# Patient Record
Sex: Female | Born: 1980 | Race: White | Hispanic: No | Marital: Married | Smoking: Never smoker
Health system: Southern US, Community
[De-identification: ages and names within clinical notes are randomized; demographics above are authoritative.]

## PROBLEM LIST (undated history)

## (undated) DIAGNOSIS — J309 Allergic rhinitis, unspecified: Secondary | ICD-10-CM

## (undated) DIAGNOSIS — E785 Hyperlipidemia, unspecified: Secondary | ICD-10-CM

## (undated) DIAGNOSIS — F909 Attention-deficit hyperactivity disorder, unspecified type: Secondary | ICD-10-CM

## (undated) DIAGNOSIS — M5136 Other intervertebral disc degeneration, lumbar region: Secondary | ICD-10-CM

## (undated) HISTORY — DX: Allergic rhinitis, unspecified: J30.9

## (undated) HISTORY — DX: Hyperlipidemia, unspecified: E78.5

## (undated) HISTORY — PX: KNEE SURGERY: SHX244

## (undated) HISTORY — DX: Other intervertebral disc degeneration, lumbar region: M51.36

---

## 2009-10-25 ENCOUNTER — Ambulatory Visit: Payer: Self-pay | Admitting: Family Medicine

## 2010-10-13 NOTE — Assessment & Plan Note (Signed)
Summary: SOB, chest congestion x 6 dys rm 3   Vital Signs:  Patient Profile:   30 Years Old Female CC:      Cold & URI symptoms Height:     69 inches Weight:      186 pounds O2 Sat:      98 % O2 treatment:    Room Air Temp:     97.0 degrees F rectal Pulse rate:   71 / minute Pulse rhythm:   regular Resp:     17 per minute BP sitting:   109 / 72  (right arm) Cuff size:   regular  Vitals Entered By: Areta Haber CMA (October 25, 2009 10:05 AM)                  Prior Medication List:  No prior medications documented  Current Allergies: No known allergies History of Present Illness Chief Complaint: Cold & URI symptoms History of Present Illness: Starting Sunday started coughing. Hoarseness and loss of voice as well as fatigue. She has had marked fatigue and low exercise tolerence. No wheezing , asthma or smoking HX.   Current Problems: BRONCHITIS, ACUTE (ICD-466.0) COUGH (ICD-786.2) FAMILY HISTORY DIABETES 1ST DEGREE RELATIVE (ICD-V18.0) FAMILY HISTORY OF CERVICAL CANCER (ICD-V17.3) FAMILY HISTORY BREAST CANCER 1ST DEGREE RELATIVE <50 (ICD-V16.3)   Current Meds SEASONIQUE 0.15-0.03 &0.01 MG TABS (LEVONORGEST-ETH ESTRAD 91-DAY) 1 tab by mouth once daily MUCINEX D 60-600 MG XR12H-TAB (PSEUDOEPHEDRINE-GUAIFENESIN) As directed ADVIL COLD & SINUS LIQUI-GELS 30-200 MG CAPS (PSEUDOEPHEDRINE-IBUPROFEN) As directed ZITHROMAX Z-PAK 250 MG  TABS (AZITHROMYCIN) Use as directed PROAIR HFA 108 (90 BASE) MCG/ACT  AERS (ALBUTEROL SULFATE) 2 inh q4h as needed shortness of breath  REVIEW OF SYSTEMS Constitutional Symptoms      Denies fever, chills, night sweats, weight loss, weight gain, and fatigue.  Eyes       Denies change in vision, eye pain, eye discharge, glasses, contact lenses, and eye surgery. Ear/Nose/Throat/Mouth       Complains of frequent runny nose and hoarseness.      Denies hearing loss/aids, change in hearing, ear pain, ear discharge, dizziness, frequent nose  bleeds, sinus problems, sore throat, and tooth pain or bleeding.      Comments: x 6 days Respiratory       Complains of dry cough.      Denies productive cough, wheezing, shortness of breath, asthma, bronchitis, and emphysema/COPD.      Comments: chest congestion Cardiovascular       Denies murmurs, chest pain, and tires easily with exhertion.    Gastrointestinal       Denies stomach pain, nausea/vomiting, diarrhea, constipation, blood in bowel movements, and indigestion. Genitourniary       Denies painful urination, kidney stones, and loss of urinary control. Neurological       Denies paralysis, seizures, and fainting/blackouts. Musculoskeletal       Denies muscle pain, joint pain, joint stiffness, decreased range of motion, redness, swelling, muscle weakness, and gout.  Skin       Denies bruising, unusual mles/lumps or sores, and hair/skin or nail changes.  Psych       Denies mood changes, temper/anger issues, anxiety/stress, speech problems, depression, and sleep problems. Other Comments: Pt has no PCP.   Past History:  Family History: Last updated: 10/25/2009 Family History Breast cancer 1st degree relative <50 Family History of Cervical cancer Family History Diabetes 1st degree relative Family History Other cancer  Social History: Last updated: 10/25/2009 Married Never Smoked Alcohol use-no  Regular exercise-yes  Risk Factors: Exercise: yes (10/25/2009)  Risk Factors: Smoking Status: never (10/25/2009)  Past Medical History: Unremarkable  Past Surgical History: ACL Surgery  Family History: Reviewed history and no changes required. Family History Breast cancer 1st degree relative <50 Family History of Cervical cancer Family History Diabetes 1st degree relative Family History Other cancer  Social History: Reviewed history and no changes required. Married Never Smoked Alcohol use-no Regular exercise-yes Smoking Status:  never Does Patient Exercise:   yes Physical Exam General appearance: well developed, well nourished,mild distress Head: normocephalic, atraumatic Nasal: pale, boggy, swollen nasal turbinates Neck: supple,anterior lymphadenopathy present Chest/Lungs: rare wheeze in the R lung Heart: regular rate and  rhythm, no murmur Abdomen: soft, non-tender without obvious organomegaly Back: no tenderness over musculature, straight leg raises negative bilaterally, deep tendon reflexes 2+ at achilles and patella Skin: no obvious rashes or lesions MSE: oriented to time, place, and person Assessment New Problems: BRONCHITIS, ACUTE (ICD-466.0) COUGH (ICD-786.2) FAMILY HISTORY DIABETES 1ST DEGREE RELATIVE (ICD-V18.0) FAMILY HISTORY OF CERVICAL CANCER (ICD-V17.3) FAMILY HISTORY BREAST CANCER 1ST DEGREE RELATIVE <50 (ICD-V16.3)  Bronchitis  Patient Education: Patient and/or caregiver instructed in the following: rest fluids and Tylenol.  Plan New Medications/Changes: PROAIR HFA 108 (90 BASE) MCG/ACT  AERS (ALBUTEROL SULFATE) 2 inh q4h as needed shortness of breath  #1 x 0, 10/25/2009, Hassan Rowan MD ZITHROMAX Z-PAK 250 MG  TABS (AZITHROMYCIN) Use as directed  #1 x 0, 10/25/2009, Hassan Rowan MD  New Orders: New Patient Level III [phone] T-Chest x-ray, 2 views [71020] Nebulizer Tx [94640] Solumedrol up to 125mg  [J2930] Admin of Therapeutic Inj  intramuscular or subcutaneous [96372] Ipratropium inhalation sol. unit dose [U0454] Albuterol Sulfate Sol 1mg  unit dose [J7613] Nebulizer Tx [94640] Planning Comments:   as below use nebulizer as needed  Follow Up: Follow up in 2-3 days if no improvement, Follow up with Primary Physician Work/School Excuse: Return to work/school in 3 days  The patient and/or caregiver has been counseled thoroughly with regard to medications prescribed including dosage, schedule, interactions, rationale for use, and possible side effects and they verbalize understanding.  Diagnoses and expected course  of recovery discussed and will return if not improved as expected or if the condition worsens. Patient and/or caregiver verbalized understanding.   PROCEDURE: Follow up: moving air better after aerosol treatment Prescriptions: PROAIR HFA 108 (90 BASE) MCG/ACT  AERS (ALBUTEROL SULFATE) 2 inh q4h as needed shortness of breath  #1 x 0   Entered and Authorized by:   Hassan Rowan MD   Signed by:   Hassan Rowan MD on 10/25/2009   Method used:   Print then Give to Patient   RxID:   [id number] ZITHROMAX Z-PAK 250 MG  TABS (AZITHROMYCIN) Use as directed  #1 x 0   Entered and Authorized by:   Hassan Rowan MD   Signed by:   Hassan Rowan MD on 10/25/2009   Method used:   Print then Give to Patient   RxID:   [id number]   Patient Instructions: 1)  Please schedule a follow-up appointment as needed. 2)  Please schedule an appointment with your primary doctor in :7-14 days 3)  Take your antibiotic as prescribed until ALL of it is gone, but stop if you develop a rash or swelling and contact our office as soon as possible. 4)  Acute bronchitis symptoms for less than 10 days are not helped by antibiotics. take over the counter cough medications. call if no improvment in  5-7 days, sooner  if increasing cough, fever, or new symptoms( shortness of breath, chest pain). 5)  Recommended remaining out of school for next 3 days.   Laboratory Results       Medication Administration  Injection # 1:    Medication: Solumedrol up to 125mg     Diagnosis: BRONCHITIS, ACUTE (ICD-466.0)    Route: IM    Site: LUOQ gluteus    Exp Date: 09/31/2013    Lot #: [id number]    Mfr: Pharmacia    Patient tolerated injection without complications    Given by: Areta Haber CMA (October 25, 2009 11:27 AM)  Medication # 1:    Medication: Ipratropium inhalation sol. unit dose    Diagnosis: BRONCHITIS, ACUTE (ICD-466.0)    Route: inhaled    Exp Date: 08/12/2010    Lot #: [id number]    Mfr: Mylan    Patient tolerated  medication without complications    Given by: Areta Haber CMA (October 25, 2009 11:28 AM)  Medication # 2:    Medication: Albuterol Sulfate Sol 1mg  unit dose    Diagnosis: BRONCHITIS, ACUTE (ICD-466.0)    Route: inhaled    Exp Date: 08/12/2010    Lot #: [id number]    Mfr: Mylan    Patient tolerated medication without complications    Given by: Areta Haber CMA (October 25, 2009 11:28 AM)  Orders Added: 1)  New Patient Level III [99203] 2)  T-Chest x-ray, 2 views [71020] 3)  Nebulizer Tx [94640] 4)  Solumedrol up to 125mg  [J2930] 5)  Admin of Therapeutic Inj  intramuscular or subcutaneous [96372] 6)  Ipratropium inhalation sol. unit dose [J7644] 7)  Albuterol Sulfate Sol 1mg  unit dose [J7613] 8)  Nebulizer Tx [66440]

## 2010-10-13 NOTE — Letter (Signed)
Summary: Out of Work  [facility]  [address]   [place], [place] [address]   Phone: [phone]  Fax: [phone]    October 25, 2009   Employee:  ALEZA PEW    To Whom It May Concern:   For Medical reasons, please excuse the above named employee from work for the following dates:  Start:   10/25/2009  Return:   10/28/2009  If you need additional information, please feel free to contact our office.         Sincerely,    Hassan Rowan MD

## 2010-10-19 ENCOUNTER — Encounter: Payer: Self-pay | Admitting: Family Medicine

## 2010-10-22 ENCOUNTER — Encounter: Payer: Self-pay | Admitting: Family Medicine

## 2010-10-22 ENCOUNTER — Ambulatory Visit (INDEPENDENT_AMBULATORY_CARE_PROVIDER_SITE_OTHER): Payer: BC Managed Care – PPO | Admitting: Family Medicine

## 2010-10-22 DIAGNOSIS — J069 Acute upper respiratory infection, unspecified: Secondary | ICD-10-CM

## 2010-10-27 ENCOUNTER — Encounter: Payer: Self-pay | Admitting: Family Medicine

## 2010-10-27 ENCOUNTER — Ambulatory Visit (INDEPENDENT_AMBULATORY_CARE_PROVIDER_SITE_OTHER): Payer: BC Managed Care – PPO | Admitting: Family Medicine

## 2010-10-27 DIAGNOSIS — M25569 Pain in unspecified knee: Secondary | ICD-10-CM

## 2010-10-29 NOTE — Assessment & Plan Note (Signed)
Summary: SINUS PRESSURE,SWEATS/WB Room 4   Vital Signs:  Patient Profile:   30 Years Old Female CC:      Left ear pain, sore throat, sinus headache x 6 days Height:     69 inches Weight:      192 pounds O2 Sat:      99 % O2 treatment:    Room Air Temp:     99.1 degrees F oral Pulse rate:   77 / minute Pulse rhythm:   regular Resp:     16 per minute BP sitting:   128 / 83  (left arm) Cuff size:   regular  Vitals Entered By: Emilio Math (October 22, 2010 8:50 AM)                  Current Allergies: No known allergies History of Present Illness Chief Complaint: Left ear pain, sore throat, sinus headache x 6 days History of Present Illness:  Subjective: Patient complains of sore throa that started 5 days ago and now resolved.  She still has soreness in her anterior neck. + cough for about 3 days No pleuritic pain No wheezing + nasal congestion + post-nasal drainage + frontal  sinus pain/pressure No itchy/red eyes ? right earache No hemoptysis No SOB + fever/chills + nausea resolved No vomiting No abdominal pain No diarrhea No skin rashes + fatigue No myalgias + headache Used OTC meds without relief   Current Meds SEASONIQUE 0.15-0.03 &0.01 MG TABS (LEVONORGEST-ETH ESTRAD 91-DAY) 1 tab by mouth once daily MUCINEX D 60-600 MG XR12H-TAB (PSEUDOEPHEDRINE-GUAIFENESIN) As directed AZITHROMYCIN 250 MG TABS (AZITHROMYCIN) Two tabs by mouth on day 1, then 1 tab daily on days 2 through 5 (Rx void after 10/30/10) BENZONATATE 200 MG CAPS (BENZONATATE) One by mouth hs as needed cough  REVIEW OF SYSTEMS Constitutional Symptoms       Complains of fever and chills.     Denies night sweats, weight loss, weight gain, and fatigue.  Eyes       Denies change in vision, eye pain, eye discharge, glasses, contact lenses, and eye surgery. Ear/Nose/Throat/Mouth       Complains of ear pain and sore throat.      Denies hearing loss/aids, change in hearing, ear discharge,  dizziness, frequent runny nose, frequent nose bleeds, sinus problems, hoarseness, and tooth pain or bleeding.  Respiratory       Complains of productive cough.      Denies dry cough, wheezing, shortness of breath, asthma, bronchitis, and emphysema/COPD.  Cardiovascular       Denies murmurs, chest pain, and tires easily with exhertion.    Gastrointestinal       Denies stomach pain, nausea/vomiting, diarrhea, constipation, blood in bowel movements, and indigestion. Genitourniary       Denies painful urination, kidney stones, and loss of urinary control. Neurological       Denies paralysis, seizures, and fainting/blackouts. Musculoskeletal       Denies muscle pain, joint pain, joint stiffness, decreased range of motion, redness, swelling, muscle weakness, and gout.  Skin       Denies bruising, unusual mles/lumps or sores, and hair/skin or nail changes.  Psych       Denies mood changes, temper/anger issues, anxiety/stress, speech problems, depression, and sleep problems.  Past History:  Past Medical History: Reviewed history from 10/25/2009 and no changes required. Unremarkable  Past Surgical History: Reviewed history from 10/25/2009 and no changes required. ACL Surgery  Family History: Reviewed history from 10/25/2009 and  no changes required. 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  Extraocular movement is intact.  Conjunctivae are not inflamed.  Ears:  Canals normal.  Tympanic membranes normal.   Nose:  Normal septum.  Normal turbinates, mildly congested.  Frontal sinus tenderness present.  Pharynx:   Normal  Neck:  Supple.  Tender shotty anterior/posterior nodes are palpated bilaterally.  Lungs:  Clear to auscultation.  Breath sounds are equal.  Heart:  Regular rate and rhythm without murmurs, rubs, or gallops.  Abdomen:  Nontender without masses or hepatosplenomegaly.  Bowel sounds are present.  No CVA or flank tenderness.  Skin:  No rash Rapid strep test negative  CBC:  WBC 7.3 with normal diff Assessment New Problems: UPPER RESPIRATORY INFECTION (ICD-465.9)  VIRAL URI   Plan New Medications/Changes: BENZONATATE 200 MG CAPS (BENZONATATE) One by mouth hs as needed cough  #12 x 0, 10/22/2010, Donna Christen MD AZITHROMYCIN 250 MG TABS (AZITHROMYCIN) Two tabs by mouth on day 1, then 1 tab daily on days 2 through 5 (Rx void after 10/30/10)  #6 tabs x 0, 10/22/2010, Donna Christen MD  New Orders: Rapid Strep [95621] CBC w/Diff [30865-78469] Est. Patient Level III [62952] Pulse Oximetry (single measurment) [94760] Planning Comments:   Treat symptomatically for now:  Increase fluid intake, begin expectorant/decongestant, cough suppressant at bedtime.  If fever/chills/sweats persist, or if not improving 5  days begin Z-pack (given Rx to hold).  Followup with PCP if not improving 7 to 10 days.   The patient and/or caregiver has been counseled thoroughly with regard to medications prescribed including dosage, schedule, interactions, rationale for use, and possible side effects and they verbalize understanding.  Diagnoses and expected course of recovery discussed and will return if not improved as expected or if the condition worsens. Patient and/or caregiver verbalized understanding.  Prescriptions: BENZONATATE 200 MG CAPS (BENZONATATE) One by mouth hs as needed cough  #12 x 0   Entered and Authorized by:   Donna Christen MD   Signed by:   Donna Christen MD on 10/22/2010   Method used:   Print then Give to Patient   RxID:   [id number] AZITHROMYCIN 250 MG TABS (AZITHROMYCIN) Two tabs  by mouth on day 1, then 1 tab daily on days 2 through 5 (Rx void after 10/30/10)  #6 tabs x 0   Entered and Authorized by:   Donna Christen MD   Signed by:   Donna Christen MD on 10/22/2010   Method used:   Print then Give to Patient   RxID:   [id number]   Patient Instructions: 1)  Take Mucinex D (guaifenesin with decongestant) twice daily for congestion. 2)  Increase fluid intake, rest. 3)  May use Afrin nasal spray (or generic oxymetazoline) twice daily for about 5 days.  Also recommend using saline nasal spray several times daily and/or saline nasal irrigation. 4)  Begin Azithromycin if not improving about 5 days or if persistent fever develops. 5)  Followup with family doctor if not improving 7 to 10 days.   Orders Added: 1)  Rapid Strep [87880] 2)  CBC w/Diff [56433-29518] 3)  Est. Patient Level III [84166] 4)  Pulse  Oximetry (single measurment) [94760]  Appended Document: SINUS PRESSURE,SWEATS/WB Room 4 rapid strep: Negative

## 2010-11-04 NOTE — Assessment & Plan Note (Signed)
Summary: ZOX:WRUEA knee pain   Vital Signs:  Patient profile:   30 year old female Height:      69 inches Weight:      196 pounds Pulse rate:   84 / minute BP sitting:   121 / 73  (right arm) Cuff size:   regular  Vitals Entered By: Avon Gully CMA, Duncan Dull) (October 27, 2010 9:40 AM) CC: rt knee pain x 1 month, had ACL reconstruction in 10 grd   Primary Care Provider:  [name], MD  CC:  rt knee pain x 1 month and had ACL reconstruction in 10 grd.  History of Present Illness: Was playing basket ball and was hit from the side and had an ACL tear. Had recontruxtion and has done well. Pain dstarted again in December.  Has been worse when it is cold. Pain iso n both sides of the patella.  Pianful going up and down stairs. Can't knee on the ground. Pain with Varus stress.  Knows has gained some weight in the last couple of years.  Pain now for 6-8 weeks. Trying to train for a triathelon. Using IBU frequently. Right now not using anything.  Has been trying to stretch because gets tight in her hamstrings.  Occ swelling on thr right.  Sometime can't fully flex her knee.   Current Medications (verified): 1)  Seasonique 0.15-0.03 &0.01 Mg Tabs (Levonorgest-Eth Estrad 91-Day) .[name].. 1 Tab By Mouth Once Daily 2)  Mucinex D 60-600 Mg Xr12h-Tab (Pseudoephedrine-Guaifenesin) .... As Directed 3)  Azithromycin 250 Mg Tabs (Azithromycin) .... Two Tabs By Mouth On Day 1, Then 1 Tab Daily On Days 2 Through 5 (Rx Void After 10/30/10) 4)  Benzonatate 200 Mg Caps (Benzonatate) .... One By Mouth Hs As Needed Cough  Allergies (verified): No Known Drug Allergies  Comments:  Nurse/Medical Assistant: The patient's medications and allergies were reviewed with the patient and were updated in the Medication and Allergy Lists. Avon Gully CMA, Duncan Dull) (October 27, 2010 9:41 AM)  Past History:  Past Medical History: Last updated: 10/25/2009 Unremarkable  Family History: Last updated:  10/27/2010 Family History Breast cancer 1st degree relative <50 Family History of Cervical cancer Family History Diabetes 1st degree relative Family History Other cancer Parent with premature OA.   Social History: Last updated: 10/27/2010 Married.  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   Past Surgical History: ACL Surgery in 10th grade  Family History: Family History Breast cancer 1st degree relative <50 Family History of Cervical cancer Family History Diabetes 1st degree relative Family History Other cancer Parent with premature OA.   Social History: Married.  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   Physical Exam  General:  Well-developed,well-nourished,in no acute distress; alert,appropriate and cooperative throughout examination Msk:  Right Knee with normal flexion, slightly dec extension compared to the left.  No appearent swelling or bruising. NOntender on exam.  Hip, knee, and ankle strength 5/5 bilat.  Dec laxity on the right.  Neg [place]. No crepitus    Impression & Recommendations:  Problem # 1:  KNEE PAIN (ICD-719.46) Discussed will make referral since has had prior ACL repair and ans she is very active. Her exam is fairly normal but clearly I want her to be albel to train and compete.  She may needs xrays.  Orders: Orthopedic Referral (Ortho)  Complete Medication List: 1)  Seasonique 0.15-0.03 &0.01 Mg Tabs (Levonorgest-eth estrad 91-day) .[name].. 1 tab  by mouth once daily  Patient Instructions: 1)  We will call you with the referral.    Orders Added: 1)  Orthopedic Referral [Ortho] 2)  New Patient Level III [23762]

## 2010-11-05 ENCOUNTER — Other Ambulatory Visit: Payer: Self-pay | Admitting: Sports Medicine

## 2010-11-05 ENCOUNTER — Ambulatory Visit
Admission: RE | Admit: 2010-11-05 | Discharge: 2010-11-05 | Disposition: A | Discharge status: Home / Self Care | Payer: BC Managed Care – PPO | Source: Ambulatory Visit | Attending: Sports Medicine | Admitting: Sports Medicine

## 2010-11-05 DIAGNOSIS — M25561 Pain in right knee: Secondary | ICD-10-CM

## 2010-11-10 NOTE — Letter (Signed)
Summary: Registration, Billing Information  Registration, Billing Information   Imported By: Maryln Gottron 11/02/2010 10:21:53  _____________________________________________________________________  External Attachment:    Type:   Image     Comment:   External Document

## 2010-11-30 ENCOUNTER — Ambulatory Visit: Payer: BC Managed Care – PPO | Admitting: Family Medicine

## 2010-11-30 ENCOUNTER — Encounter: Payer: Self-pay | Admitting: Family Medicine

## 2010-11-30 DIAGNOSIS — J019 Acute sinusitis, unspecified: Secondary | ICD-10-CM

## 2010-12-10 NOTE — Assessment & Plan Note (Signed)
Summary: Acute sinusitis   Vital Signs:  Patient profile:   30 year old female Height:      69 inches Weight:      194 pounds Temp:     98.0 degrees F oral Pulse rate:   106 / minute BP sitting:   115 / 79  (right arm) Cuff size:   regular  Vitals Entered By: Avon Gully CMA, Duncan Dull) (November 30, 2010 3:30 PM) CC: congestion sinus pressure ,sore lymph node left neck?, sxs since last wed   Primary Care Provider:  [name], MD  CC:  congestion sinus pressure , sore lymph node left neck?, and sxs since last wed.  History of Present Illness: congestion sinus pressure ,sore lymph node left neck?, sxs since last wed. 6 days of sxs. + fever.  bilateral nasal congestion. Started with ST nad severe earache on the left. ST is better the last 3 days. Taking mucnicex-D, salt water gargesl, airborne tabs and using a netti-pot.  Tooks some old pills for decongestant and sleep.  LN in neck is better today. +nauseated.  No vomiting or diarrhea.    Current Medications (verified): 1)  Seasonique 0.15-0.03 &0.01 Mg Tabs (Levonorgest-Eth Estrad 91-Day) .[name].. 1 Tab By Mouth Once Daily  Allergies (verified): No Known Drug Allergies  Comments:  Nurse/Medical Assistant: The patient's medications and allergies were reviewed with the patient and were updated in the Medication and Allergy Lists. Avon Gully CMA, Duncan Dull) (November 30, 2010 3:31 PM)  Past History:  Family History: Last updated: 10/27/2010 Family History Breast cancer 1st degree relative <50 Family History of Cervical cancer Family History Diabetes 1st degree relative Family History Other cancer Parent with premature OA.   Physical Exam  General:  Well-developed,well-nourished,in no acute distress; alert,appropriate and cooperative throughout examination Head:  Normocephalic and atraumatic without obvious abnormalities. No apparent alopecia or balding. Eyes:  No corneal or conjunctival inflammation noted. EOMI.  Perrla.  Ears:  External ear exam shows no significant lesions or deformities.  Otoscopic examination reveals clear canals, tympanic membranes are intact bilaterally without bulging, retraction, inflammation or discharge. Hearing is grossly normal bilaterally. Nose:  External nasal examination shows no deformity or inflammation. Nasal mucosa are pink and moist without lesions or exudates. Turbinates are swollen.  Mouth:  Oral mucosa and oropharynx without lesions or exudates.  Teeth in good repair. Neck:  No deformities, masses, or tenderness noted. Lungs:  Normal respiratory effort, chest expands symmetrically. Lungs are clear to auscultation, no crackles or wheezes. Heart:  Normal rate and regular rhythm. S1 and S2 normal without gallop, murmur, click, rub or other extra sounds. Skin:  no rashes.   Cervical Nodes:  No lymphadenopathy noted Psych:  Cognition and judgment appear intact. Alert and cooperative with normal attention span and concentration. No apparent delusions, illusions, hallucinations   Impression & Recommendations:  Problem # 1:  SINUSITIS - ACUTE-NOS (ICD-461.9)  Likely viral.  But if not better in 4-5 days can fill the rx for the antibiotic.   Start the nasal steroid.  Her updated medication list for this problem includes:    Amoxicillin 875 Mg Tabs (Amoxicillin) .[name]... Take 1 tablet by mouth two times a day for 10 days    Flonase 50 Mcg/act Susp (Fluticasone propionate) .[name]... 2 sprays in each nostril daily  Instructed on treatment. Call if symptoms persist or worsen.   Complete Medication List: 1)  Seasonique 0.15-0.03 &0.01 Mg Tabs (Levonorgest-eth estrad 91-day) .[name].. 1 tab by mouth once daily 2)  Amoxicillin  875 Mg Tabs (Amoxicillin) .... Take 1 tablet by mouth two times a day for 10 days 3)  Flonase 50 Mcg/act Susp (Fluticasone propionate) .... 2 sprays in each nostril daily  Patient Instructions: 1)  If not better in 4-5 days can fill the antibiotic  prescriptions.   Prescriptions: FLONASE 50 MCG/ACT SUSP (FLUTICASONE PROPIONATE) 2 sprays in each nostril daily  #1 x 1   Entered and Authorized by:   [name] MD   Signed by:   [name] MD on 11/30/2010   Method used:   Electronically to        [facility] [phone]* (retail)       [address] [place], [place]  [address]       Ph: [phone]       Fax: [phone]   RxID:   [id number] AMOXICILLIN 875 MG TABS (AMOXICILLIN) Take 1 tablet by mouth two times a day for 10 days  #20 x 0   Entered and Authorized by:   [name] MD   Signed by:   [name] MD on 11/30/2010   Method used:   Print then Give to Patient   RxID:   [id number]

## 2011-04-07 ENCOUNTER — Ambulatory Visit: Payer: BC Managed Care – PPO | Attending: Sports Medicine | Admitting: Physical Therapy

## 2011-04-07 DIAGNOSIS — IMO0001 Reserved for inherently not codable concepts without codable children: Secondary | ICD-10-CM | POA: Insufficient documentation

## 2011-04-07 DIAGNOSIS — M2550 Pain in unspecified joint: Secondary | ICD-10-CM | POA: Insufficient documentation

## 2011-04-07 DIAGNOSIS — M2560 Stiffness of unspecified joint, not elsewhere classified: Secondary | ICD-10-CM | POA: Insufficient documentation

## 2011-05-20 ENCOUNTER — Encounter: Payer: Self-pay | Admitting: Family Medicine

## 2011-05-20 ENCOUNTER — Ambulatory Visit (INDEPENDENT_AMBULATORY_CARE_PROVIDER_SITE_OTHER): Payer: BC Managed Care – PPO | Admitting: Family Medicine

## 2011-05-20 VITALS — BP 130/83 | HR 89 | Temp 98.2°F | Wt 183.0 lb

## 2011-05-20 DIAGNOSIS — J029 Acute pharyngitis, unspecified: Secondary | ICD-10-CM

## 2011-05-20 DIAGNOSIS — R5381 Other malaise: Secondary | ICD-10-CM

## 2011-05-20 DIAGNOSIS — R5383 Other fatigue: Secondary | ICD-10-CM

## 2011-05-20 LAB — POCT RAPID STREP A (OFFICE): Rapid Strep A Screen: NEGATIVE

## 2011-05-20 NOTE — Patient Instructions (Signed)
We will call you with your lab results.  Salt water gargles.

## 2011-05-20 NOTE — Progress Notes (Signed)
  Subjective:    Patient ID: Kristina Harrington, female    DOB: 1981/07/01, 30 y.o.   MRN: [id number]  HPI At the beginning of aug she started school and has been really stressed. Feels tired and drainage. INtermittant ST.  Noticed  White spot on her tonsil.  Noticed she hs bad breath.  Took yesterday off of work today.  No pain with eating and swallowing. LT ear is painful and drainage in her throat.  Using her netti-pot.  No fever or nasal congestion.  Her stress is a little better.    Review of Systems     Objective:   Physical Exam  Constitutional: She is oriented to person, place, and time. She appears well-developed and well-nourished.  HENT:  Head: Normocephalic and atraumatic.  Right Ear: External ear normal.  Left Ear: External ear normal.  Nose: Nose normal.  Mouth/Throat: Oropharynx is clear and moist.       TMs and canals are clear.   Eyes: Conjunctivae and EOM are normal. Pupils are equal, round, and reactive to light.  Neck: Neck supple. No thyromegaly present.       Small right ant cerv LN   Cardiovascular: Normal rate, regular rhythm and normal heart sounds.   Pulmonary/Chest: Effort normal and breath sounds normal. She has no wheezes.  Lymphadenopathy:    She has no cervical adenopathy.  Neurological: She is alert and oriented to person, place, and time.  Skin: Skin is warm and dry.  Psychiatric: She has a normal mood and affect.          Assessment & Plan:  Pharyngitis - strep is neg. Will check for EBV and CMV.  Will check CBC. Could also be allergies or just fatigue from stress and not sleeping well  Otalgia- ear exam is nl.

## 2011-05-21 LAB — CBC WITH DIFFERENTIAL/PLATELET
Basophils Relative: 0 % (ref 0–1)
Eosinophils Relative: 1 % (ref 0–5)
Hemoglobin: 15.3 g/dL — ABNORMAL HIGH (ref 12.0–15.0)
Lymphocytes Relative: 32 % (ref 12–46)
Lymphs Abs: 2.3 10*3/uL (ref 0.7–4.0)
MCH: 33.9 pg (ref 26.0–34.0)
MCHC: 34.5 g/dL (ref 30.0–36.0)
Monocytes Relative: 4 % (ref 3–12)
Platelets: 239 10*3/uL (ref 150–400)
WBC: 7.2 10*3/uL (ref 4.0–10.5)

## 2011-05-24 ENCOUNTER — Telehealth: Payer: Self-pay | Admitting: Family Medicine

## 2011-05-24 LAB — RSV(RESPIRATORY SYNCYTIAL VIRUS) AB, BLOOD

## 2011-05-24 NOTE — Telephone Encounter (Signed)
Call pt:  Neg for CMV and EBV.  Thyroid is normal. White count was normal. No sign of infection. Still waiting for RSV results. Likley from stress, not sleeping well.

## 2011-05-24 NOTE — Telephone Encounter (Signed)
Pt.notified

## 2011-05-24 NOTE — Telephone Encounter (Signed)
Call pt: RSV is neg.

## 2011-05-25 NOTE — Telephone Encounter (Signed)
Pt.notified

## 2011-09-16 ENCOUNTER — Ambulatory Visit (INDEPENDENT_AMBULATORY_CARE_PROVIDER_SITE_OTHER): Payer: BC Managed Care – PPO | Admitting: Family Medicine

## 2011-09-16 ENCOUNTER — Encounter: Payer: Self-pay | Admitting: Family Medicine

## 2011-09-16 VITALS — BP 107/71 | HR 89 | Temp 98.0°F | Ht 69.0 in | Wt 186.0 lb

## 2011-09-16 DIAGNOSIS — Z2839 Other underimmunization status: Secondary | ICD-10-CM

## 2011-09-16 DIAGNOSIS — H6693 Otitis media, unspecified, bilateral: Secondary | ICD-10-CM

## 2011-09-16 DIAGNOSIS — J0100 Acute maxillary sinusitis, unspecified: Secondary | ICD-10-CM

## 2011-09-16 DIAGNOSIS — Z23 Encounter for immunization: Secondary | ICD-10-CM

## 2011-09-16 DIAGNOSIS — H6690 Otitis media, unspecified, unspecified ear: Secondary | ICD-10-CM

## 2011-09-16 DIAGNOSIS — Z283 Underimmunization status: Secondary | ICD-10-CM

## 2011-09-16 MED ORDER — AMOXICILLIN-POT CLAVULANATE 875-125 MG PO TABS: 1.0000 | ORAL_TABLET | Freq: Two times a day (BID) | ORAL | Status: AC

## 2011-09-16 MED ORDER — FEXOFENADINE-PSEUDOEPHED ER 180-240 MG PO TB24: 1.0000 | ORAL_TABLET | Freq: Every day | ORAL | Status: AC

## 2011-09-16 NOTE — Patient Instructions (Signed)
Sinusitis Sinuses are air pockets within the bones of your face. The growth of bacteria within a sinus leads to infection. The infection prevents the sinuses from draining. This infection is called sinusitis. SYMPTOMS  There will be different areas of pain depending on which sinuses have become infected.  The maxillary sinuses often produce pain beneath the eyes.   Frontal sinusitis may cause pain in the middle of the forehead and above the eyes.  Other problems (symptoms) include:  Toothaches.   Colored, pus-like (purulent) drainage from the nose.   Swelling, warmth, and tenderness over the sinus areas may be signs of infection.  TREATMENT  Sinusitis is most often determined by an exam.X-rays may be taken. If x-rays have been taken, make sure you obtain your results or find out how you are to obtain them. Your caregiver may give you medications (antibiotics). These are medications that will help kill the bacteria causing the infection. You may also be given a medication (decongestant) that helps to reduce sinus swelling.  HOME CARE INSTRUCTIONS   Only take over-the-counter or prescription medicines for pain, discomfort, or fever as directed by your caregiver.   Drink extra fluids. Fluids help thin the mucus so your sinuses can drain more easily.   Applying either moist heat or ice packs to the sinus areas may help relieve discomfort.   Use saline nasal sprays to help moisten your sinuses. The sprays can be found at your local drugstore.  SEEK IMMEDIATE MEDICAL CARE IF:  You have a fever.   You have increasing pain, severe headaches, or toothache.   You have nausea, vomiting, or drowsiness.   You develop unusual swelling around the face or trouble seeing.  MAKE SURE YOU:   Understand these instructions.   Will watch your condition.   Will get help right away if you are not doing well or get worse.  Document Released: 08/30/2005 Document Revised: 05/12/2011 Document Reviewed:  03/29/2007 [facility] Patient Information 2012 [place], [place].Otitis Media with Effusion Otitis media with effusion is the presence of fluid in the middle ear. This is a common problem that often follows ear infections. It may be present for weeks or longer after the infection. Unlike an acute ear infection, otits media with effusion refers only to fluid behind the ear drum and not infection. Children with repeated ear and sinus infections and allergy problems are the most likely to get otitis media with effusion. CAUSES  The most frequent cause of the fluid buildup is dysfunction of the eustacian tubes. These are the tubes that drain fluid in the ears to the throat. SYMPTOMS   The main symptom of this condition is hearing loss. As a result, you or your child may:   Listen to the TV at a loud volume.   Not respond to questions.   Ask "what" often when spoken to.   There may be a sensation of fullness or pressure but usually not pain.  DIAGNOSIS   Your caregiver will diagnose this condition by examining you or your child's ears.   Your caregiver may test the pressure in you or your child's ear with a tympanometer.   A hearing test may be conducted if the problem persists.   A caregiver will want to re-evaluate the condition periodically to see if it improves.  TREATMENT   Treatment depends on the duration and the effects of the effusion.   Antibiotics, decongestants, nose drops, and cortisone-type drugs may not be helpful.   Children with persistent ear  effusions may have delayed language. Children at risk for developmental delays in hearing, learning, and speech may require referral to a specialist earlier than children not at risk.   You or your child's caregiver may suggest a referral to an Ear, Nose, and Throat (ENT) surgeon for treatment. The following may help restore normal hearing:   Drainage of fluid.   Placement of ear tubes (tympanostomy tubes).   Removal of adenoids  (adenoidectomy).  HOME CARE INSTRUCTIONS   Avoid second hand smoke.   Infants who are breast fed are less likely to have this condition.   Avoid feeding infants while laying flat.   Avoid known environmental allergens.   Be sure to see a caregiver or an ENT specialist for follow up.   Avoid people who are sick.  SEEK MEDICAL CARE IF:   Hearing is not better in 3 months.   Hearing is worse.   Ear pain.   Drainage from the ear.   Dizziness.  Document Released: 10/07/2004 Document Revised: 05/12/2011 Document Reviewed: 01/20/2010 [facility] Patient Information 2012 [place], [place].

## 2011-09-16 NOTE — Progress Notes (Signed)
Subjective:     Patient ID: Kristina Harrington, female   DOB: 1981/02/27, 31 y.o.   MRN: [id number]  Sinusitis This is a new problem. The current episode started in the past 7 days. The problem has been gradually worsening since onset. Associated symptoms include coughing and a sore throat. Past treatments include oral decongestants and sitting up. The treatment provided no relief.  Cough This is a new problem. The current episode started in the past 7 days. The problem has been gradually worsening. The problem occurs constantly. The cough is productive of sputum. Associated symptoms include ear congestion, nasal congestion, postnasal drip, rhinorrhea and a sore throat. The symptoms are aggravated by other. She has tried OTC cough suppressant for the symptoms. The treatment provided no relief. Her past medical history is significant for bronchitis. There is no history of asthma, bronchiectasis, COPD or environmental allergies.     Review of Systems  HENT: Positive for sore throat, rhinorrhea and postnasal drip.   Respiratory: Positive for cough.   Hematological: Negative for environmental allergies.  All other systems reviewed and are negative.   BP 107/71  Pulse 89  Temp(Src) 98 F (36.7 C) (Oral)  Ht 5\' 9"  (1.753 m)  Wt 186 lb (84.369 kg)  BMI 27.47 kg/m2  SpO2 98%  LMP 09/06/2011   Objective:   Physical Exam  Nursing note and vitals reviewed. Constitutional: She is oriented to person, place, and time. She appears well-developed and well-nourished. No distress.  HENT:  Head: Normocephalic and atraumatic.  Right Ear: Hearing, external ear and ear canal normal. Tympanic membrane is erythematous.  Left Ear: Hearing, external ear and ear canal normal. Tympanic membrane is erythematous.  Nose: Mucosal edema and rhinorrhea present. Right sinus exhibits maxillary sinus tenderness. Left sinus exhibits maxillary sinus tenderness.  Mouth/Throat: Oropharynx is clear and moist. No oral lesions.  No oropharyngeal exudate.  Eyes: Right eye exhibits no discharge. Left eye exhibits no discharge. No scleral icterus.  Neck: Normal range of motion. Neck supple.  Cardiovascular: Normal rate, regular rhythm and normal heart sounds.   Pulmonary/Chest: Effort normal and breath sounds normal. She has no wheezes. She has no rales.  Lymphadenopathy:    She has cervical adenopathy.  Neurological: She is alert and oriented to person, place, and time.  Skin: Skin is warm and dry.  Psychiatric: She has a normal mood and affect. Her behavior is normal.       Assessment:     BOM , Sinusitis, URI    Plan:     Work note until Saturday, continue flonase, add Careers adviser -D

## 2011-11-02 ENCOUNTER — Encounter: Payer: Self-pay | Admitting: Physician Assistant

## 2011-11-02 ENCOUNTER — Ambulatory Visit (INDEPENDENT_AMBULATORY_CARE_PROVIDER_SITE_OTHER): Payer: BC Managed Care – PPO | Admitting: Physician Assistant

## 2011-11-02 VITALS — BP 129/76 | HR 102 | Temp 98.1°F | Wt 186.0 lb

## 2011-11-02 DIAGNOSIS — R112 Nausea with vomiting, unspecified: Secondary | ICD-10-CM

## 2011-11-02 DIAGNOSIS — R1084 Generalized abdominal pain: Secondary | ICD-10-CM

## 2011-11-02 MED ORDER — KETOROLAC TROMETHAMINE 60 MG/2ML IM SOLN
60.0000 mg | Freq: Once | INTRAMUSCULAR | Status: AC
  Administered 2011-11-02: 60 mg via INTRAMUSCULAR

## 2011-11-02 MED ORDER — PROMETHAZINE HCL 25 MG/ML IJ SOLN
25.0000 mg | Freq: Once | INTRAMUSCULAR | Status: AC
  Administered 2011-11-02: 25 mg via INTRAMUSCULAR

## 2011-11-02 MED ORDER — ONDANSETRON HCL 4 MG PO TABS: 4.0000 mg | ORAL_TABLET | Freq: Three times a day (TID) | ORAL | Status: AC | PRN

## 2011-11-02 NOTE — Progress Notes (Signed)
Addended by: Darla Lesches A on: 11/02/2011 01:22 PM   Modules accepted: Orders

## 2011-11-02 NOTE — Patient Instructions (Addendum)
If there are any changes such as worsening pain, not being able to have bowel movement, onset of fever. Please call office and we will get CT. Call back in 24 hours to let us know how you are doing. Sent Zofran to pharmacy to use as needed for nausea.   Gave Phenergan and Toradol shots.

## 2011-11-02 NOTE — Progress Notes (Signed)
  Subjective:    Patient ID: Kristina Harrington, female    DOB: [birth year], 31 y.o.   MRN: [id number]  HPI Patient presents to clinic with abdominal cramps since 6:30 this morning. She has never had anything like this before. The pains were mild at first and then progressed to excuriating in 2-3 hours. When she is having a cramp the pain is 10/10 but when she is not it is 5/10. She is sweating she is in so much pain. She has had nausea and vomiting this am about 3-4times. It is brownish in color. She has had 5 episodes of diarrhea with not blood or mucus present. She has no appetite. She has never had any problems with her gallbladder and she still has her appendix. She denies any pain with urination or vaginal discharge. She has not had a fever or been sick recently. She felt fine yesterday.    Review of Systems     Objective:   Physical Exam  Constitutional: She appears distressed.       Patient is very upset and crying in pain.   Pulmonary/Chest:       No CVA tenderness.   Abdominal: Soft.       Hypoactive bowel sounds.Tenderness in every quadrant. Negative for Murphys sign. Negative Psoas sign. Patient had an intense cramp while on table and started crying. No distention or guarding.  Skin: She is diaphoretic.          Assessment & Plan:  Generalized abdominal pain- Gave toradal 60mg  IM for pain.Encouraged patient to use a heating pad on abdomen to help with discomfort. Sent for CBC will call with results. Suspect Viral Gastroenteritis; however, if CBC extremely elevated with send for CT of abdomen and Pelvis to rule out Gall stones, appenditis, or small bowel obstruction. Patient instructed to call office in 24 hours with update on progress and if any changes or worsening to call Urgent Care or go to ER.   Nausea and Vomiting- Gave Phenergan 25 IM. Sent zofran to use as needed for nausea to pharmacy. Stay hydrated. Order CMP to check electrolytes.

## 2011-11-03 ENCOUNTER — Telehealth: Payer: Self-pay | Admitting: *Deleted

## 2011-11-03 DIAGNOSIS — R1110 Vomiting, unspecified: Secondary | ICD-10-CM

## 2011-11-03 LAB — COMPREHENSIVE METABOLIC PANEL
ALT: 20 U/L (ref 0–35)
Albumin: 4.4 g/dL (ref 3.5–5.2)
CO2: 24 mEq/L (ref 19–32)
Calcium: 9.4 mg/dL (ref 8.4–10.5)
Chloride: 104 mEq/L (ref 96–112)
Glucose, Bld: 107 mg/dL — ABNORMAL HIGH (ref 70–99)
Potassium: 5.3 mEq/L (ref 3.5–5.3)
Sodium: 140 mEq/L (ref 135–145)
Total Protein: 7.6 g/dL (ref 6.0–8.3)

## 2011-11-03 LAB — CBC WITH DIFFERENTIAL/PLATELET
Eosinophils Absolute: 0 10*3/uL (ref 0.0–0.7)
Hemoglobin: 15.5 g/dL — ABNORMAL HIGH (ref 12.0–15.0)
Lymphocytes Relative: 4 % — ABNORMAL LOW (ref 12–46)
Lymphs Abs: 0.4 10*3/uL — ABNORMAL LOW (ref 0.7–4.0)
MCH: 32.8 pg (ref 26.0–34.0)
Monocytes Relative: 3 % (ref 3–12)
Neutrophils Relative %: 94 % — ABNORMAL HIGH (ref 43–77)
Platelets: 205 10*3/uL (ref 150–400)
RBC: 4.72 MIL/uL (ref 3.87–5.11)
WBC: 11.5 10*3/uL — ABNORMAL HIGH (ref 4.0–10.5)

## 2011-11-03 NOTE — Telephone Encounter (Signed)
If still vomiting then will order CT of abd/pelvis.  Can use tylenol for pain. Order placed.

## 2011-11-03 NOTE — Telephone Encounter (Signed)
Called pt with results and husband states diarrhea and vomiting seem to have slowed down but pt is still having a lot of abd pain that comes and goes. Please advise. Also wanted to know what she could take for the pain

## 2011-11-03 NOTE — Telephone Encounter (Signed)
Spoke with husband and she has not vomited since yesterday. Do you still want the scan

## 2011-11-04 ENCOUNTER — Ambulatory Visit
Admission: RE | Admit: 2011-11-04 | Discharge: 2011-11-04 | Disposition: A | Discharge status: Home / Self Care | Payer: BC Managed Care – PPO | Source: Ambulatory Visit | Attending: Family Medicine | Admitting: Family Medicine

## 2011-11-04 DIAGNOSIS — R1110 Vomiting, unspecified: Secondary | ICD-10-CM

## 2011-11-04 MED ORDER — IOHEXOL 300 MG/ML  SOLN
100.0000 mL | Freq: Once | INTRAMUSCULAR | Status: AC | PRN
  Administered 2011-11-04: 100 mL via INTRAVENOUS

## 2012-04-14 ENCOUNTER — Encounter: Payer: Self-pay | Admitting: Family Medicine

## 2012-04-14 ENCOUNTER — Ambulatory Visit (INDEPENDENT_AMBULATORY_CARE_PROVIDER_SITE_OTHER): Payer: BC Managed Care – PPO | Admitting: Family Medicine

## 2012-04-14 VITALS — BP 109/70 | HR 84 | Temp 98.1°F | Wt 196.0 lb

## 2012-04-14 DIAGNOSIS — J069 Acute upper respiratory infection, unspecified: Secondary | ICD-10-CM

## 2012-04-14 NOTE — Patient Instructions (Signed)
Discharge Instructions Browse by Alphabet A B C D E F G H I J K L M N O P Q R S T U V W X Y Z  Browse by Category All Documents Allergy and Immunology Anesthesiology [facility] Bioterrorism Cardiology Critical Care Dentistry Dermatology Diabetes Dietary Easy-to-Read Emergency Medicine Endocrinology ENT Family Medicine Forms Gastroenterology Geriatrics Hematology Home Health Care Infectious Disease Internal Medicine Labs and Tests Neonatology Nephrology Neurology Obstetrics and Gynecology Oncology Ophthalmology Orthopedics Pediatrics Pharmacology Physical Medicine and Rehabilitation Podiatry Preventive Medicine Procedures Psychiatry Pulmonary Medicine Radiology Rheumatology Surgery Urology Drug Information Sheets All Drug Information Sheets  Browse by Alphabet A B C D E F G H I J K L M N O P Q R S T U V W X Y Z Upper Respiratory Infection, Adult An upper respiratory infection (URI) is also sometimes known as the common cold. The upper respiratory tract includes the nose, sinuses, throat, trachea, and bronchi. Bronchi are the airways leading to the lungs. Most people improve within 1 week, but symptoms can last up to 2 weeks. A residual cough may last even longer.   CAUSES Many different viruses can infect the tissues lining the upper respiratory tract. The tissues become irritated and inflamed and often become very moist. Mucus production is also common. A cold is contagious. You can easily spread the virus to others by oral contact. This includes kissing, sharing a glass, coughing, or sneezing. Touching your mouth or nose and then touching a surface, which is then touched by another person, can also spread the virus. SYMPTOMS   Symptoms typically develop 1 to 3 days after you come in contact with a cold virus. Symptoms vary from person to person. They may include:  Runny nose.   Sneezing.   Nasal congestion.   Sinus irritation.   Sore throat.   Loss  of voice (laryngitis).   Cough.   Fatigue.   Muscle aches.   Loss of appetite.   Headache.   Low-grade fever.  DIAGNOSIS   You might diagnose your own cold based on familiar symptoms, since most people get a cold 2 to 3 times a year. Your caregiver can confirm this based on your exam. Most importantly, your caregiver can check that your symptoms are not due to another disease such as strep throat, sinusitis, pneumonia, asthma, or epiglottitis. Blood tests, throat tests, and X-rays are not necessary to diagnose a common cold, but they may sometimes be helpful in excluding other more serious diseases. Your caregiver will decide if any further tests are required. RISKS AND COMPLICATIONS   You may be at risk for a more severe case of the common cold if you smoke cigarettes, have chronic heart disease (such as heart failure) or lung disease (such as asthma), or if you have a weakened immune system. The very young and very old are also at risk for more serious infections. Bacterial sinusitis, middle ear infections, and bacterial pneumonia can complicate the common cold. The common cold can worsen asthma and chronic obstructive pulmonary disease (COPD). Sometimes, these complications can require emergency medical care and may be life-threatening. PREVENTION   The best way to protect against getting a cold is to practice good hygiene. Avoid oral or hand contact with people with cold symptoms. Wash your hands often if contact occurs. There is no clear evidence that vitamin C, vitamin E, echinacea, or exercise reduces the chance of developing a cold. However, it is always recommended to get plenty of rest and  practice good nutrition. TREATMENT   Treatment is directed at relieving symptoms. There is no cure. Antibiotics are not effective, because the infection is caused by a virus, not by bacteria. Treatment may include:  Increased fluid intake. Sports drinks offer valuable electrolytes, sugars, and  fluids.   Breathing heated mist or steam (vaporizer or shower).   Eating chicken soup or other clear broths, and maintaining good nutrition.   Getting plenty of rest.   Using gargles or lozenges for comfort.   Controlling fevers with ibuprofen or acetaminophen as directed by your caregiver.   Increasing usage of your inhaler if you have asthma.  Zinc gel and zinc lozenges, taken in the first 24 hours of the common cold, can shorten the duration and lessen the severity of symptoms. Pain medicines may help with fever, muscle aches, and throat pain. A variety of non-prescription medicines are available to treat congestion and runny nose. Your caregiver can make recommendations and may suggest nasal or lung inhalers for other symptoms.   HOME CARE INSTRUCTIONS    Only take over-the-counter or prescription medicines for pain, discomfort, or fever as directed by your caregiver.   Use a warm mist humidifier or inhale steam from a shower to increase air moisture. This may keep secretions moist and make it easier to breathe.   Drink enough water and fluids to keep your urine clear or pale yellow.   Rest as needed.   Return to work when your temperature has returned to normal or as your caregiver advises. You may need to stay home longer to avoid infecting others. You can also use a face mask and careful hand washing to prevent spread of the virus.  SEEK MEDICAL CARE IF:    After the first few days, you feel you are getting worse rather than better.   You need your caregiver's advice about medicines to control symptoms.   You develop chills, worsening shortness of breath, or brown or red sputum. These may be signs of pneumonia.   You develop yellow or brown nasal discharge or pain in the face, especially when you bend forward. These may be signs of sinusitis.   You develop a fever, swollen neck glands, pain with swallowing, or white areas in the back of your throat. These may be signs of  strep throat.  SEEK IMMEDIATE MEDICAL CARE IF:    You have a fever.   You develop severe or persistent headache, ear pain, sinus pain, or chest pain.   You develop wheezing, a prolonged cough, cough up blood, or have a change in your usual mucus (if you have chronic lung disease).   You develop sore muscles or a stiff neck.  Document Released: 02/23/2001 Document Revised: 08/19/2011 Document Reviewed: 01/01/2011 [facility] Patient Information 2012 [place], [place].

## 2012-04-14 NOTE — Progress Notes (Signed)
  Subjective:    Patient ID: Kristina Harrington, female    DOB: 1981/02/17, 31 y.o.   MRN: [id number]  HPI 5 days of nasal congestion.  Works at summer camp.  Very tired.  Mild ST. Gargling for relief.  2 days ago was her worst day. + post nasal drip.  Voice is hoarse.  Both ears are hurting. No fever.  Had HA earlier this week but that is better.  No cough.     Review of Systems     Objective:   Physical Exam  Constitutional: She is oriented to person, place, and time. She appears well-developed and well-nourished.  HENT:  Head: Normocephalic and atraumatic.  Right Ear: External ear normal.  Left Ear: External ear normal.  Nose: Nose normal.  Mouth/Throat: Oropharynx is clear and moist.       TMs and canals are clear.   Eyes: Conjunctivae and EOM are normal. Pupils are equal, round, and reactive to light.  Neck: Neck supple. No thyromegaly present.  Cardiovascular: Normal rate, regular rhythm and normal heart sounds.   Pulmonary/Chest: Effort normal and breath sounds normal. She has no wheezes.  Lymphadenopathy:    She has no cervical adenopathy.  Neurological: She is alert and oriented to person, place, and time.  Skin: Skin is warm and dry.  Psychiatric: She has a normal mood and affect.          Assessment & Plan:  URI - likely viral. Given h.o on symptomatic care during pregnancy. If not better by Monday or if suddenly worse then call the office.

## 2012-04-17 ENCOUNTER — Telehealth: Payer: Self-pay | Admitting: *Deleted

## 2012-04-17 MED ORDER — AZITHROMYCIN 250 MG PO TABS: ORAL_TABLET | ORAL | Status: AC

## 2012-04-17 NOTE — Telephone Encounter (Signed)
Notified pt via VM of MD instructions. KG LPN

## 2012-04-17 NOTE — Telephone Encounter (Signed)
Pt calls and states that she is not feeling better and was told to call. Ears are still hurting and congestion and drainage in throat

## 2012-04-17 NOTE — Telephone Encounter (Signed)
Also give her prescription for azithromycin. She's not significantly better by the end of the antibiotic then please followup.

## 2013-06-26 ENCOUNTER — Ambulatory Visit (INDEPENDENT_AMBULATORY_CARE_PROVIDER_SITE_OTHER): Payer: BC Managed Care – PPO | Admitting: Family Medicine

## 2013-06-26 ENCOUNTER — Encounter: Payer: Self-pay | Admitting: Family Medicine

## 2013-06-26 VITALS — BP 129/74 | HR 89 | Temp 98.0°F | Wt 209.0 lb

## 2013-06-26 DIAGNOSIS — J019 Acute sinusitis, unspecified: Secondary | ICD-10-CM

## 2013-06-26 MED ORDER — AMOXICILLIN-POT CLAVULANATE 875-125 MG PO TABS: 1.0000 | ORAL_TABLET | Freq: Two times a day (BID) | ORAL | Status: DC

## 2013-06-26 NOTE — Progress Notes (Signed)
  Subjective:    Patient ID: Kristina Harrington, female    DOB: [birth year], 32 y.o.   MRN: [id number]  HPI Sinus sxs x 8 days.  ST andstarted gargling and felt a little better and then started with sneezing, nasal congestion, then HA.  Used advil cold and sinus. Green nasal d/c.  By Monday was exhausted and felt worse. No fever, chills, or sweats.  Daughter with similar sxs.   Review of Systems     Objective:   Physical Exam  Constitutional: She is oriented to person, place, and time. She appears well-developed and well-nourished.  HENT:  Head: Normocephalic and atraumatic.  Right Ear: External ear normal.  Left Ear: External ear normal.  Nose: Nose normal.  Mouth/Throat: Oropharynx is clear and moist.  TMs and canals are clear.   Eyes: Conjunctivae and EOM are normal. Pupils are equal, round, and reactive to light.  Neck: Neck supple. No thyromegaly present.  Cardiovascular: Normal rate, regular rhythm and normal heart sounds.   Pulmonary/Chest: Effort normal and breath sounds normal. She has no wheezes.  Lymphadenopathy:    She has no cervical adenopathy.  Neurological: She is alert and oriented to person, place, and time.  Skin: Skin is warm and dry.  Psychiatric: She has a normal mood and affect. Her behavior is normal.          Assessment & Plan:  Acute sinusitis - will treat with Augmentin. Explained we typically will treat after 10 days of illness. She's been sick for about 8-9 days at this point time. We'll go ahead and send a prescription. Recommend rest, fluids and symptomatic care. If she's not feeling some better by the end of the week and please give [place] a call.

## 2013-08-14 ENCOUNTER — Encounter: Payer: Self-pay | Admitting: Family Medicine

## 2013-08-14 ENCOUNTER — Ambulatory Visit (INDEPENDENT_AMBULATORY_CARE_PROVIDER_SITE_OTHER): Payer: BC Managed Care – PPO | Admitting: Family Medicine

## 2013-08-14 VITALS — BP 101/66 | HR 76 | Temp 98.1°F | Wt 207.0 lb

## 2013-08-14 DIAGNOSIS — J019 Acute sinusitis, unspecified: Secondary | ICD-10-CM

## 2013-08-14 MED ORDER — AMOXICILLIN-POT CLAVULANATE 875-125 MG PO TABS: 1.0000 | ORAL_TABLET | Freq: Two times a day (BID) | ORAL | Status: DC

## 2013-08-14 NOTE — Progress Notes (Signed)
   Subjective:    Patient ID: Kristina Harrington, female    DOB: 15-Sep-1980, 32 y.o.   MRN: [id number]  HPI Symptoms started 5 days ago with sore throat, left ear pain and throat are tender to touch ane now the right ear is hurting, yellow nasal mucous.  + fatigue. No fever chills or sweats.  Gargles and oceans saline spary and using humidifier.  Baby has a URI as well. Using vicks vapor rub as well.    Review of Systems     Objective:   Physical Exam  Constitutional: She is oriented to person, place, and time. She appears well-developed and well-nourished.  HENT:  Head: Normocephalic and atraumatic.  Right Ear: External ear normal.  Left Ear: External ear normal.  Nose: Nose normal.  Mouth/Throat: Oropharynx is clear and moist.  TMs and canals are clear.   Eyes: Conjunctivae and EOM are normal. Pupils are equal, round, and reactive to light.  Neck: Neck supple. No thyromegaly present.  Cardiovascular: Normal rate, regular rhythm and normal heart sounds.   Pulmonary/Chest: Effort normal and breath sounds normal. She has no wheezes.  Lymphadenopathy:    She has no cervical adenopathy.  Neurological: She is alert and oriented to person, place, and time.  Skin: Skin is warm and dry.  Psychiatric: She has a normal mood and affect. Her behavior is normal. Judgment and thought content normal.          Assessment & Plan:  Acute sinusitis - Can fill rx if not better in 3-4 days or if suddenly gets worse. Has ENT f/u in Jan.  Call if not improving.  Continue symptomatic care.

## 2013-09-20 ENCOUNTER — Ambulatory Visit (INDEPENDENT_AMBULATORY_CARE_PROVIDER_SITE_OTHER): Payer: BC Managed Care – PPO

## 2013-09-20 ENCOUNTER — Ambulatory Visit (INDEPENDENT_AMBULATORY_CARE_PROVIDER_SITE_OTHER): Payer: BC Managed Care – PPO | Admitting: Sports Medicine

## 2013-09-20 ENCOUNTER — Encounter: Payer: Self-pay | Admitting: Sports Medicine

## 2013-09-20 VITALS — BP 116/73 | HR 70 | Wt 209.0 lb

## 2013-09-20 DIAGNOSIS — M5136 Other intervertebral disc degeneration, lumbar region: Secondary | ICD-10-CM | POA: Insufficient documentation

## 2013-09-20 DIAGNOSIS — G8929 Other chronic pain: Secondary | ICD-10-CM

## 2013-09-20 DIAGNOSIS — M549 Dorsalgia, unspecified: Secondary | ICD-10-CM

## 2013-09-20 DIAGNOSIS — M51369 Other intervertebral disc degeneration, lumbar region without mention of lumbar back pain or lower extremity pain: Secondary | ICD-10-CM

## 2013-09-20 DIAGNOSIS — M5137 Other intervertebral disc degeneration, lumbosacral region: Secondary | ICD-10-CM

## 2013-09-20 DIAGNOSIS — M51379 Other intervertebral disc degeneration, lumbosacral region without mention of lumbar back pain or lower extremity pain: Secondary | ICD-10-CM

## 2013-09-20 HISTORY — DX: Other intervertebral disc degeneration, lumbar region: M51.36

## 2013-09-20 HISTORY — DX: Other intervertebral disc degeneration, lumbar region without mention of lumbar back pain or lower extremity pain: M51.369

## 2013-09-20 MED ORDER — CYCLOBENZAPRINE HCL 10 MG PO TABS: ORAL_TABLET | ORAL | Status: DC

## 2013-09-20 MED ORDER — PREDNISONE 50 MG PO TABS: ORAL_TABLET | ORAL | Status: DC

## 2013-09-20 MED ORDER — MELOXICAM 15 MG PO TABS: ORAL_TABLET | ORAL | Status: DC

## 2013-09-20 NOTE — Progress Notes (Signed)
   Subjective:    I'm seeing this patient as a consultation for:  Dr. 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 Symptoms are moderate, persistent, there are no constitutional symptoms or bowel or bladder dysfunction.  Past medical history, Surgical history, Family history not pertinant except as noted below, Social history, Allergies, and medications have been entered into the medical record, reviewed, and no changes needed.   Review of Systems: No headache, visual changes, nausea, vomiting, diarrhea, constipation, dizziness, abdominal pain, skin rash, fevers, chills, night sweats, weight loss, swollen lymph nodes, body aches, joint swelling, muscle aches, chest pain, shortness of breath, mood changes, visual or auditory hallucinations.   Objective:   General: Well Developed, well nourished, and in no acute distress.  Neuro/Psych: Alert and oriented x3, extra-ocular muscles intact, able to move all 4 extremities, sensation grossly intact. Skin: Warm and dry, no rashes noted.  Respiratory: Not using accessory muscles, speaking in full sentences, trachea midline.  Cardiovascular: Pulses palpable, no extremity edema. Abdomen: Does not appear distended. Back Exam:  Inspection: Unremarkable  Motion: Flexion 45 deg, Extension 45 deg, Side Bending to 45 deg bilaterally,  Rotation to 45 deg bilaterally  SLR laying: Negative  XSLR laying: Negative  Palpable tenderness: None. FABER: negative. Sensory change: Gross sensation intact to all lumbar and sacral dermatomes.    Reflexes: 2+ at both patellar tendons, 2+ at achilles tendons, Babinski's downgoing.  Strength at foot  Plantar-flexion: 5/5 Dorsi-flexion: 5/5 Eversion: 5/5 Inversion: 5/5  Leg strength  Quad: 5/5 Hamstring: 5/5 Hip flexor: 5/5 Hip abductors: 5/5  Gait unremarkable.  On personal review of her CT scan I do see his protrusions worse at the L3-L4 level but also present at the L4-L5 and L5-S1 levels.  Impression and Recommendations:   This case required medical decision making of moderate complexity.

## 2013-09-20 NOTE — Assessment & Plan Note (Signed)
Symptoms are predominantly discogenic, she did have an episode of right-sided L5 radiculitis. Based on her CT scan I do see L3-L4 degenerative disc disease, it is also less so at the L4-L5 and L5-S1 level We are going to start conservatively with prednisone, each bedtime muscle relaxers, Mobic, formal physical therapy, and x-rays. I like to see her back in about a month, if no better we will proceed with MRI for interventions.

## 2013-09-21 ENCOUNTER — Telehealth: Payer: Self-pay | Admitting: Sports Medicine

## 2013-09-21 NOTE — Telephone Encounter (Signed)
PLEASE SEE NOTE BELOW. Rhonda Cunningham,CMA  

## 2013-09-21 NOTE — Telephone Encounter (Signed)
Pt called and wants to know when will meds start kicking in because she is still in pain. She does not think meds are working.

## 2013-09-21 NOTE — Telephone Encounter (Signed)
I just saw her yesterday, give it a week or so. If no better I can add tramadol.

## 2013-09-24 NOTE — Telephone Encounter (Signed)
Left detailed message on patient mvm advising her to give the current medication a week to work and if no better we can add Tramadol to her pain medication. Rhonda Cunningham,CMA

## 2013-09-25 ENCOUNTER — Ambulatory Visit (INDEPENDENT_AMBULATORY_CARE_PROVIDER_SITE_OTHER): Payer: BC Managed Care – PPO | Admitting: Physical Therapy

## 2013-09-25 DIAGNOSIS — M51379 Other intervertebral disc degeneration, lumbosacral region without mention of lumbar back pain or lower extremity pain: Secondary | ICD-10-CM

## 2013-09-25 DIAGNOSIS — M6281 Muscle weakness (generalized): Secondary | ICD-10-CM

## 2013-09-25 DIAGNOSIS — M545 Low back pain, unspecified: Secondary | ICD-10-CM

## 2013-09-25 DIAGNOSIS — M2560 Stiffness of unspecified joint, not elsewhere classified: Secondary | ICD-10-CM

## 2013-09-25 DIAGNOSIS — M5137 Other intervertebral disc degeneration, lumbosacral region: Secondary | ICD-10-CM

## 2013-09-27 ENCOUNTER — Encounter (INDEPENDENT_AMBULATORY_CARE_PROVIDER_SITE_OTHER): Payer: BC Managed Care – PPO

## 2013-09-27 DIAGNOSIS — M545 Low back pain, unspecified: Secondary | ICD-10-CM

## 2013-09-27 DIAGNOSIS — M6281 Muscle weakness (generalized): Secondary | ICD-10-CM

## 2013-09-27 DIAGNOSIS — M5137 Other intervertebral disc degeneration, lumbosacral region: Secondary | ICD-10-CM

## 2013-09-27 DIAGNOSIS — M2560 Stiffness of unspecified joint, not elsewhere classified: Secondary | ICD-10-CM

## 2013-10-02 ENCOUNTER — Encounter: Payer: BC Managed Care – PPO | Admitting: Physical Therapy

## 2013-10-03 ENCOUNTER — Encounter (INDEPENDENT_AMBULATORY_CARE_PROVIDER_SITE_OTHER): Payer: BC Managed Care – PPO

## 2013-10-03 DIAGNOSIS — M545 Low back pain, unspecified: Secondary | ICD-10-CM

## 2013-10-03 DIAGNOSIS — M6281 Muscle weakness (generalized): Secondary | ICD-10-CM

## 2013-10-03 DIAGNOSIS — M2560 Stiffness of unspecified joint, not elsewhere classified: Secondary | ICD-10-CM

## 2013-10-03 DIAGNOSIS — M5137 Other intervertebral disc degeneration, lumbosacral region: Secondary | ICD-10-CM

## 2013-10-09 ENCOUNTER — Encounter (INDEPENDENT_AMBULATORY_CARE_PROVIDER_SITE_OTHER): Payer: BC Managed Care – PPO | Admitting: Physical Therapy

## 2013-10-09 DIAGNOSIS — M6281 Muscle weakness (generalized): Secondary | ICD-10-CM

## 2013-10-09 DIAGNOSIS — M545 Low back pain, unspecified: Secondary | ICD-10-CM

## 2013-10-09 DIAGNOSIS — M2560 Stiffness of unspecified joint, not elsewhere classified: Secondary | ICD-10-CM

## 2013-10-09 DIAGNOSIS — M5137 Other intervertebral disc degeneration, lumbosacral region: Secondary | ICD-10-CM

## 2013-10-10 ENCOUNTER — Encounter (INDEPENDENT_AMBULATORY_CARE_PROVIDER_SITE_OTHER): Payer: BC Managed Care – PPO | Admitting: Physical Therapy

## 2013-10-10 DIAGNOSIS — M5137 Other intervertebral disc degeneration, lumbosacral region: Secondary | ICD-10-CM

## 2013-10-10 DIAGNOSIS — M545 Low back pain, unspecified: Secondary | ICD-10-CM

## 2013-10-10 DIAGNOSIS — M2560 Stiffness of unspecified joint, not elsewhere classified: Secondary | ICD-10-CM

## 2013-10-10 DIAGNOSIS — M6281 Muscle weakness (generalized): Secondary | ICD-10-CM

## 2013-10-16 ENCOUNTER — Encounter: Payer: BC Managed Care – PPO | Admitting: Physical Therapy

## 2013-10-17 ENCOUNTER — Encounter (INDEPENDENT_AMBULATORY_CARE_PROVIDER_SITE_OTHER): Payer: BC Managed Care – PPO | Admitting: Physical Therapy

## 2013-10-17 DIAGNOSIS — M545 Low back pain, unspecified: Secondary | ICD-10-CM

## 2013-10-17 DIAGNOSIS — M2560 Stiffness of unspecified joint, not elsewhere classified: Secondary | ICD-10-CM

## 2013-10-17 DIAGNOSIS — M6281 Muscle weakness (generalized): Secondary | ICD-10-CM

## 2013-10-17 DIAGNOSIS — M5137 Other intervertebral disc degeneration, lumbosacral region: Secondary | ICD-10-CM

## 2013-10-23 ENCOUNTER — Encounter (INDEPENDENT_AMBULATORY_CARE_PROVIDER_SITE_OTHER): Payer: BC Managed Care – PPO | Admitting: Physical Therapy

## 2013-10-23 DIAGNOSIS — M545 Low back pain, unspecified: Secondary | ICD-10-CM

## 2013-10-23 DIAGNOSIS — M5137 Other intervertebral disc degeneration, lumbosacral region: Secondary | ICD-10-CM

## 2013-10-23 DIAGNOSIS — M6281 Muscle weakness (generalized): Secondary | ICD-10-CM

## 2013-10-23 DIAGNOSIS — M2560 Stiffness of unspecified joint, not elsewhere classified: Secondary | ICD-10-CM

## 2013-10-24 ENCOUNTER — Encounter: Payer: BC Managed Care – PPO | Admitting: Physical Therapy

## 2013-10-25 ENCOUNTER — Encounter: Payer: Self-pay | Admitting: Sports Medicine

## 2013-10-25 ENCOUNTER — Ambulatory Visit (INDEPENDENT_AMBULATORY_CARE_PROVIDER_SITE_OTHER): Payer: BC Managed Care – PPO | Admitting: Sports Medicine

## 2013-10-25 VITALS — BP 108/68 | HR 72 | Ht 69.0 in | Wt 208.0 lb

## 2013-10-25 DIAGNOSIS — M51369 Other intervertebral disc degeneration, lumbar region without mention of lumbar back pain or lower extremity pain: Secondary | ICD-10-CM

## 2013-10-25 DIAGNOSIS — M51379 Other intervertebral disc degeneration, lumbosacral region without mention of lumbar back pain or lower extremity pain: Secondary | ICD-10-CM

## 2013-10-25 DIAGNOSIS — M5136 Other intervertebral disc degeneration, lumbar region: Secondary | ICD-10-CM

## 2013-10-25 DIAGNOSIS — M5137 Other intervertebral disc degeneration, lumbosacral region: Secondary | ICD-10-CM

## 2013-10-25 NOTE — Progress Notes (Signed)
  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 I placed her through physical therapy, steroids, NSAIDs, and muscle relaxers, overall her symptoms have completely resolved. She's incorporating physical therapy modalities and her daily life, and is very happy with the results so far.  Past medical history, Surgical history, Family history not pertinant except as noted below, Social history, Allergies, and medications have been entered into the medical record, reviewed, and no changes needed.   Review of Systems: No fevers, chills, night sweats, weight loss, chest pain, or shortness of breath.   Objective:    General: Well Developed, well nourished, and in no acute distress.  Neuro: Alert and oriented x3, extra-ocular muscles intact, sensation grossly intact.  HEENT: Normocephalic, atraumatic, pupils equal round reactive to light, neck supple, no masses, no lymphadenopathy, thyroid nonpalpable.  Skin: Warm and dry, no rashes. Cardiac: Regular rate and rhythm, no murmurs rubs or gallops, no lower extremity edema.  Respiratory: Clear to auscultation bilaterally. Not using accessory muscles, speaking in full sentences. Back Exam:  Inspection: Unremarkable  Motion: Flexion 45 deg, Extension 45 deg, Side Bending to 45 deg bilaterally,  Rotation to 45 deg bilaterally  SLR laying: Negative  XSLR laying: Negative  Palpable tenderness: None. FABER: negative. Sensory change: Gross sensation intact to all lumbar and sacral dermatomes.  Reflexes: 2+ at both patellar tendons, 2+ at achilles tendons, Babinski's downgoing.  Strength at foot  Plantar-flexion: 5/5 Dorsi-flexion: 5/5 Eversion: 5/5 Inversion: 5/5  Leg strength  Quad: 5/5 Hamstring: 5/5 Hip flexor: 5/5 Hip abductors: 5/5  Gait unremarkable.  Impression and Recommendations:

## 2013-10-25 NOTE — Assessment & Plan Note (Signed)
Symptoms completely resolved. Return as needed. 

## 2013-11-02 ENCOUNTER — Ambulatory Visit (INDEPENDENT_AMBULATORY_CARE_PROVIDER_SITE_OTHER): Payer: BC Managed Care – PPO | Admitting: Sports Medicine

## 2013-11-02 ENCOUNTER — Ambulatory Visit (INDEPENDENT_AMBULATORY_CARE_PROVIDER_SITE_OTHER): Payer: BC Managed Care – PPO

## 2013-11-02 ENCOUNTER — Encounter: Payer: Self-pay | Admitting: Sports Medicine

## 2013-11-02 VITALS — BP 116/71 | HR 82 | Wt 211.0 lb

## 2013-11-02 DIAGNOSIS — M7631 Iliotibial band syndrome, right leg: Secondary | ICD-10-CM | POA: Insufficient documentation

## 2013-11-02 DIAGNOSIS — M629 Disorder of muscle, unspecified: Secondary | ICD-10-CM

## 2013-11-02 DIAGNOSIS — M2420 Disorder of ligament, unspecified site: Secondary | ICD-10-CM

## 2013-11-02 NOTE — Progress Notes (Signed)
  Subjective:    CC: Right knee pain  HPI: Low back pain: Resolved with conservative measures.  Right knee pain: Present for several months, radiates from the right lateral thigh, over the anterolateral joint, to Gerdy's tubercle, worse with running, she does get occasional popping and snapping. Pain is moderate, persistent.  Past medical history, Surgical history, Family history not pertinant except as noted below, Social history, Allergies, and medications have been entered into the medical record, reviewed, and no changes needed.   Review of Systems: No fevers, chills, night sweats, weight loss, chest pain, or shortness of breath.   Objective:    General: Well Developed, well nourished, and in no acute distress.  Neuro: Alert and oriented x3, extra-ocular muscles intact, sensation grossly intact.  HEENT: Normocephalic, atraumatic, pupils equal round reactive to light, neck supple, no masses, no lymphadenopathy, thyroid nonpalpable.  Skin: Warm and dry, no rashes. Cardiac: Regular rate and rhythm, no murmurs rubs or gallops, no lower extremity edema.  Respiratory: Clear to auscultation bilaterally. Not using accessory muscles, speaking in full sentences. Right Knee: Normal to inspection with no erythema or effusion or obvious bony abnormalities. Palpation normal with no warmth, joint line tenderness, patellar tenderness, or condyle tenderness. ROM full in flexion and extension and lower leg rotation. Ligaments with solid consistent endpoints including ACL, PCL, LCL, MCL. Negative Mcmurray's, Apley's, and Thessalonian tests. Non painful patellar compression. Patellar glide without crepitus. Patellar and quadriceps tendons unremarkable. Hamstring and quadriceps strength is normal.  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  Impression and Recommendations:

## 2013-11-02 NOTE — Assessment & Plan Note (Signed)
Formal PT, Mobic, Xrays. She is post Horticulturist, commercialACL reconstruction.

## 2013-11-29 ENCOUNTER — Ambulatory Visit: Payer: BC Managed Care – PPO | Admitting: Sports Medicine

## 2013-12-06 ENCOUNTER — Ambulatory Visit (INDEPENDENT_AMBULATORY_CARE_PROVIDER_SITE_OTHER): Payer: BC Managed Care – PPO | Admitting: Sports Medicine

## 2013-12-06 ENCOUNTER — Encounter: Payer: Self-pay | Admitting: Sports Medicine

## 2013-12-06 VITALS — BP 129/78 | HR 88 | Temp 98.4°F | Ht 69.0 in | Wt 207.0 lb

## 2013-12-06 DIAGNOSIS — J0100 Acute maxillary sinusitis, unspecified: Secondary | ICD-10-CM

## 2013-12-06 MED ORDER — AMOXICILLIN-POT CLAVULANATE 875-125 MG PO TABS: 1.0000 | ORAL_TABLET | Freq: Two times a day (BID) | ORAL | Status: DC

## 2013-12-06 MED ORDER — BECLOMETHASONE DIPROP MONOHYD 42 MCG/SPRAY NA SUSP: 2.0000 | Freq: Two times a day (BID) | NASAL | Status: DC

## 2013-12-06 NOTE — Progress Notes (Signed)
  Subjective:    CC: Sinus infection  HPI: Kristina Harrington returns, she is a 33 year old healthy female, for the past several days she's had left-sided burning facial symptoms, with radiation to the ear, as well as a cough and runny nose. Symptoms are moderate, persistent, no constitutional symptoms, no GI symptoms, no rash.  Past medical history, Surgical history, Family history not pertinant except as noted below, Social history, Allergies, and medications have been entered into the medical record, reviewed, and no changes needed.   Review of Systems: No fevers, chills, night sweats, weight loss, chest pain, or shortness of breath.   Objective:    General: Well Developed, well nourished, and in no acute distress.  Neuro: Alert and oriented x3, extra-ocular muscles intact, sensation grossly intact.  HEENT: Normocephalic, atraumatic, pupils equal round reactive to light, neck supple, no masses, no lymphadenopathy, thyroid nonpalpable. Oropharynx, nasopharynx, external ear canals are unremarkable, tender to palpation over the maxillary sinus on the left. Skin: Warm and dry, no rashes. Cardiac: Regular rate and rhythm, no murmurs rubs or gallops, no lower extremity edema.  Respiratory: Clear to auscultation bilaterally. Not using accessory muscles, speaking in full sentences.  Impression and Recommendations:

## 2013-12-06 NOTE — Assessment & Plan Note (Signed)
Approximately 2- 3 sinus infections per year, adding Augmentin and Beconase. Return to see me if no better.

## 2013-12-07 ENCOUNTER — Telehealth: Payer: Self-pay | Admitting: *Deleted

## 2013-12-07 MED ORDER — FLUTICASONE PROPIONATE 50 MCG/ACT NA SUSP: NASAL | Status: DC

## 2013-12-07 NOTE — Telephone Encounter (Signed)
Called patient left message on mvm advising patient that Flonase was sent to pharmacy. Avionna Bower,CMA

## 2013-12-07 NOTE — Telephone Encounter (Signed)
Pt calls and states that the insurance did not want to pay for the Beconase NS- we initiated a prior auth yesterday for this but have not gotten a response back from insurance and she is going out of town at 12:00pm today and wanted to know if you could send in another med for her so she would have when leaves out of town.  Please advise Barry DienesKimberly Advay Volante, LPN

## 2013-12-07 NOTE — Telephone Encounter (Signed)
Flonase called in

## 2013-12-13 ENCOUNTER — Telehealth: Payer: Self-pay | Admitting: *Deleted

## 2013-12-13 NOTE — Telephone Encounter (Signed)
Prior auth approved for Beconase nasal spray through [employer]. Approval dates are 12/06/13-09/12/38.

## 2013-12-20 ENCOUNTER — Ambulatory Visit: Payer: BC Managed Care – PPO | Admitting: Family Medicine

## 2014-06-24 ENCOUNTER — Encounter: Payer: Self-pay | Admitting: Family Medicine

## 2014-06-24 ENCOUNTER — Ambulatory Visit (INDEPENDENT_AMBULATORY_CARE_PROVIDER_SITE_OTHER): Payer: Managed Care, Other (non HMO) | Admitting: Family Medicine

## 2014-06-24 VITALS — BP 130/75 | HR 77 | Temp 98.4°F | Wt 195.0 lb

## 2014-06-24 DIAGNOSIS — J0101 Acute recurrent maxillary sinusitis: Secondary | ICD-10-CM

## 2014-06-24 MED ORDER — DOXYCYCLINE HYCLATE 100 MG PO TABS: ORAL_TABLET | ORAL | Status: AC

## 2014-06-24 NOTE — Progress Notes (Signed)
CC: Kristina RoadsJamie Harrington is a [age] y.o. female is here for Otalgia and Sore Throat   Subjective: HPI:  Complains of facial pressure localized in the maxillary region just below the eyes that has been present for a week now. Symptoms began mild in severity slowly worsening now moderate in severity. Pain radiates into the upper molars. Accompanied by thick nasal discharge and subjective postnasal drip with a sore throat. Reports chills this morning but no fevers or night sweats. Denies any motor or sensory disturbances. No chest pain shortness of breath, wheezing, cough nor rash    Review Of Systems Outlined In HPI  No past medical history on file.  No past surgical history on file. No family history on file.  History   Social History  . Marital Status: Married    Spouse Name: N/A    Number of Children: N/A  . Years of Education: N/A   Occupational History  . Not on file.   Social History Main Topics  . Smoking status: Never Smoker   . Smokeless tobacco: Not on file  . Alcohol Use: Not on file  . Drug Use: Not on file  . Sexual Activity: Not on file   Other Topics Concern  . Not on file   Social History Narrative  . No narrative on file     Objective: BP 130/75  Pulse 77  Temp(Src) 98.4 F (36.9 C) (Oral)  Wt 195 lb (88.451 kg)  General: Alert and Oriented, No Acute Distress HEENT: Pupils equal, round, reactive to light. Conjunctivae clear.  External ears unremarkable, canals clear with intact TMs with appropriate landmarks.  Middle ear appears open without effusion. Boggy erythematous inferior turbinates with moderate mucoid discharge.  Moist mucous membranes, pharynx without inflammation nor lesions however moderate post nasal drip.  Neck supple without palpable lymphadenopathy nor abnormal masses. Lungs: Clear to auscultation bilaterally, no wheezing/ronchi/rales.  Comfortable work of breathing. Good air movement. Extremities: No peripheral edema.  Strong peripheral  pulses.  Mental Status: No depression, anxiety, nor agitation. Skin: Warm and dry.  Assessment & Plan: Asher MuirJamie was seen today for otalgia and sore throat.  Diagnoses and associated orders for this visit:  Acute recurrent maxillary sinusitis - doxycycline (VIBRA-TABS) 100 MG tablet; One by mouth twice a day for ten days.    Start doxycycline, nasal saline washes, Alka-Seltzer cold and sinus as needed.  Return if symptoms worsen or fail to improve.

## 2014-06-26 ENCOUNTER — Telehealth: Payer: Self-pay | Admitting: *Deleted

## 2014-06-26 DIAGNOSIS — J0100 Acute maxillary sinusitis, unspecified: Secondary | ICD-10-CM

## 2014-06-26 MED ORDER — PREDNISONE 20 MG PO TABS: ORAL_TABLET | ORAL | Status: AC

## 2014-06-26 MED ORDER — LEVOFLOXACIN 500 MG PO TABS: 500.0000 mg | ORAL_TABLET | Freq: Every day | ORAL | Status: DC

## 2014-06-26 NOTE — Telephone Encounter (Signed)
Stop doxycycline, switch to both levofloxacin and prednisone sent to her [facility] on file.

## 2014-06-26 NOTE — Telephone Encounter (Signed)
Pt.notified

## 2014-06-26 NOTE — Telephone Encounter (Signed)
Pt states the abx is upsetting her stomach and she is still having pain. She also having diarrhea.

## 2014-08-26 ENCOUNTER — Ambulatory Visit (INDEPENDENT_AMBULATORY_CARE_PROVIDER_SITE_OTHER): Payer: Managed Care, Other (non HMO) | Admitting: Physician Assistant

## 2014-08-26 ENCOUNTER — Encounter: Payer: Self-pay | Admitting: Physician Assistant

## 2014-08-26 VITALS — BP 114/73 | HR 76 | Temp 98.0°F | Ht 68.0 in | Wt 192.0 lb

## 2014-08-26 DIAGNOSIS — J0100 Acute maxillary sinusitis, unspecified: Secondary | ICD-10-CM

## 2014-08-26 MED ORDER — AMOXICILLIN-POT CLAVULANATE 875-125 MG PO TABS: 1.0000 | ORAL_TABLET | Freq: Two times a day (BID) | ORAL | Status: DC

## 2014-08-26 MED ORDER — FEXOFENADINE-PSEUDOEPHED ER 180-240 MG PO TB24: 1.0000 | ORAL_TABLET | Freq: Every day | ORAL | Status: DC

## 2014-08-27 NOTE — Progress Notes (Signed)
   Subjective:    Patient ID: Kristina Harrington, female    DOB: Mar 31, 1981, 33 y.o.   MRN: [id number]  HPI Patient is a 33 year old female who presents to the clinic with 2 weeks of upper respiratory symptoms. They've continued to progress to lots of headaches and sinus pressure. Patient has a history of sinusitis. She was last treated with Levaquin because the previous antibiotic did not work of doxycycline. She usually responds best to Augmentin. She's tried nasal saline washes, Nettie pot and Mucinex with no relief.   Review of Systems  All other systems reviewed and are negative.      Objective:   Physical Exam  Constitutional: She is oriented to person, place, and time. She appears well-developed and well-nourished.  HENT:  Head: Normocephalic and atraumatic.  Right Ear: External ear normal.  Left Ear: External ear normal.  Nose: Nose normal.  Mouth/Throat: Oropharynx is clear and moist.  TMs clear bilaterally.  Maxillary and frontal sinuses were both tender to palpation bilaterally.  Nasal turbinates are red and swollen bilaterally  .  Eyes: Conjunctivae are normal. Right eye exhibits no discharge. Left eye exhibits no discharge.  Neck: Normal range of motion. Neck supple.  Cardiovascular: Normal rate, regular rhythm and normal heart sounds.   Pulmonary/Chest: Effort normal and breath sounds normal. She has no wheezes.  Lymphadenopathy:    She has no cervical adenopathy.  Neurological: She is alert and oriented to person, place, and time.  Skin: Skin is dry.  Psychiatric: She has a normal mood and affect. Her behavior is normal.          Assessment & Plan:  Acute maxillary recurrent sinusitis-treated with Augmentin for 10 days today. Continue with symptomatic care of Nettie pot and Mucinex. Consider staying on a antihistamine if symptoms are triggered by allergies. Discussed with patient consider allergy testing.

## 2014-12-11 ENCOUNTER — Encounter: Payer: Self-pay | Admitting: Physician Assistant

## 2014-12-11 ENCOUNTER — Ambulatory Visit (INDEPENDENT_AMBULATORY_CARE_PROVIDER_SITE_OTHER): Payer: Managed Care, Other (non HMO) | Admitting: Physician Assistant

## 2014-12-11 VITALS — BP 112/71 | HR 69 | Temp 97.7°F | Wt 200.0 lb

## 2014-12-11 DIAGNOSIS — J301 Allergic rhinitis due to pollen: Secondary | ICD-10-CM | POA: Diagnosis not present

## 2014-12-11 DIAGNOSIS — J309 Allergic rhinitis, unspecified: Secondary | ICD-10-CM | POA: Insufficient documentation

## 2014-12-11 DIAGNOSIS — J329 Chronic sinusitis, unspecified: Secondary | ICD-10-CM | POA: Insufficient documentation

## 2014-12-11 HISTORY — DX: Allergic rhinitis, unspecified: J30.9

## 2014-12-11 MED ORDER — AZITHROMYCIN 250 MG PO TABS: ORAL_TABLET | ORAL | Status: DC

## 2014-12-11 MED ORDER — METHYLPREDNISOLONE SODIUM SUCC 125 MG IJ SOLR
125.0000 mg | Freq: Once | INTRAMUSCULAR | Status: AC
  Administered 2014-12-11: 125 mg via INTRAVENOUS

## 2014-12-11 NOTE — Progress Notes (Signed)
   Subjective:    Patient ID: Kristina Harrington, female    DOB: 1980-12-01, 34 y.o.   MRN: [id number]  HPI Pt is a 34 yo female who presents to the clinic with blowing out yellow/green mucus. She has tons of sinus pressure, itchy eyes, mild ST, and nasal congestion. Patient has a strong history of allergic sinusitis/allergic rhinitis. She sees allergist. She was allergic to almost every environmental allergy except mold. She is a candidate for immunotherapy; however, she wants to hold off since she is trying to get pregnant. No fever, chills, wheezing or shortness of breath. She does have a dry to wet cough.   Review of Systems  All other systems reviewed and are negative.      Objective:   Physical Exam  Constitutional: She is oriented to person, place, and time. She appears well-developed and well-nourished.  HENT:  Head: Normocephalic and atraumatic.  Right Ear: External ear normal.  Left Ear: External ear normal.  Mouth/Throat: Oropharynx is clear and moist. No oropharyngeal exudate.  TM's slightly erythematous but no signs of infection.   Tenderness over maxillary sinuses to palpation.   Bilateral nasal turbinates very red and swollen.   Eyes:  Injected conjunctiva bilaterally with watery discharge.   Neck: Normal range of motion. Neck supple.  Cardiovascular: Normal rate, regular rhythm and normal heart sounds.   Pulmonary/Chest: Effort normal and breath sounds normal. She has no wheezes.  Lymphadenopathy:    She has no cervical adenopathy.  Neurological: She is alert and oriented to person, place, and time.  Skin: Skin is dry.  Psychiatric: She has a normal mood and affect. Her behavior is normal.          Assessment & Plan:  Allergic sinusitis/allergic rhinitis- continue claritin, singulair, dymista. i printed off zpak for patient to start if symptoms are worsening or not improving in next 3 days. Solumedrol 125mg  IM given today. Hopefully rain will help to wash some  allergens out. Strongly urged pt to follow up with Dr. Evonnie DawesScurrvy and to discuss injections. Use probiotics if start abx concern is multiple abx usage. Wanted to try to avoid oral prednisone. Follow up if symptoms worsening or not improving.

## 2014-12-23 ENCOUNTER — Other Ambulatory Visit: Payer: Self-pay | Admitting: Adult Health

## 2014-12-23 ENCOUNTER — Ambulatory Visit (INDEPENDENT_AMBULATORY_CARE_PROVIDER_SITE_OTHER): Payer: Self-pay

## 2014-12-23 DIAGNOSIS — M25562 Pain in left knee: Secondary | ICD-10-CM

## 2014-12-23 DIAGNOSIS — R52 Pain, unspecified: Secondary | ICD-10-CM

## 2015-03-05 ENCOUNTER — Encounter: Payer: Self-pay | Admitting: Family Medicine

## 2015-03-05 ENCOUNTER — Ambulatory Visit (INDEPENDENT_AMBULATORY_CARE_PROVIDER_SITE_OTHER): Payer: Managed Care, Other (non HMO) | Admitting: Family Medicine

## 2015-03-05 VITALS — BP 99/58 | HR 74 | Temp 98.4°F | Ht 68.0 in | Wt 204.0 lb

## 2015-03-05 DIAGNOSIS — J069 Acute upper respiratory infection, unspecified: Secondary | ICD-10-CM | POA: Diagnosis not present

## 2015-03-05 DIAGNOSIS — J301 Allergic rhinitis due to pollen: Secondary | ICD-10-CM

## 2015-03-05 DIAGNOSIS — J029 Acute pharyngitis, unspecified: Secondary | ICD-10-CM

## 2015-03-05 MED ORDER — AMOXICILLIN-POT CLAVULANATE 875-125 MG PO TABS: 1.0000 | ORAL_TABLET | Freq: Two times a day (BID) | ORAL | Status: DC

## 2015-03-05 NOTE — Patient Instructions (Signed)

## 2015-03-05 NOTE — Progress Notes (Signed)
   Subjective:    Patient ID: Kristina Harrington, female    DOB: [birth year], 34 y.o.   MRN: [id number]  HPI 5 days of ST and ear pain.  Has dander allergies and has been cleaning the house intensely. Not sure if this has made her feel worse the last 2 days.  No fever. ST is a little better. Pain mostly on the left side of the neck.  Leaves town this weekend. Her ENT is considering imunotheapy for allergies. No fever, chills or sweats. She's already on a nasal steroid spray, nasal antihistamine spray, Claritin, and Singulair.   Review of Systems     Objective:   Physical Exam  Constitutional: She is oriented to person, place, and time. She appears well-developed and well-nourished.  HENT:  Head: Normocephalic and atraumatic.  Right Ear: External ear normal.  Left Ear: External ear normal.  Nose: Nose normal.  Mouth/Throat: Oropharynx is clear and moist.  TMs and canals are clear.   Eyes: Conjunctivae and EOM are normal. Pupils are equal, round, and reactive to light.  Neck: Neck supple. No thyromegaly present.  Cardiovascular: Normal rate, regular rhythm and normal heart sounds.   Pulmonary/Chest: Effort normal and breath sounds normal. She has no wheezes.  Lymphadenopathy:    She has no cervical adenopathy.  Neurological: She is alert and oriented to person, place, and time.  Skin: Skin is warm and dry.  Psychiatric: She has a normal mood and affect.          Assessment & Plan:  URI with allergies-likely viral. Continue allergy medications. I did go ahead and give her prescription for Augmentin if she feels like she's getting worse, develops a fever, or starts to get severe pains she does have a history of recurrent ear infections. I gave her the prescription just in case that she is leaving town for the week to go to [place]. Hopefully she will not needed and encouraged her not to fill it in less she develops new symptoms. Continue symptomatic care.

## 2015-03-14 HISTORY — PX: ARTHROSCOPIC REPAIR ACL: SUR80

## 2015-03-28 DIAGNOSIS — S83512A Sprain of anterior cruciate ligament of left knee, initial encounter: Secondary | ICD-10-CM | POA: Insufficient documentation

## 2015-06-20 ENCOUNTER — Ambulatory Visit: Payer: Managed Care, Other (non HMO)

## 2015-06-20 ENCOUNTER — Encounter: Payer: Self-pay | Admitting: Family Medicine

## 2015-06-20 ENCOUNTER — Ambulatory Visit (INDEPENDENT_AMBULATORY_CARE_PROVIDER_SITE_OTHER): Payer: Managed Care, Other (non HMO) | Admitting: Family Medicine

## 2015-06-20 VITALS — BP 134/83 | HR 84 | Ht 68.0 in | Wt 211.0 lb

## 2015-06-20 DIAGNOSIS — F430 Acute stress reaction: Secondary | ICD-10-CM

## 2015-06-20 DIAGNOSIS — Z23 Encounter for immunization: Secondary | ICD-10-CM

## 2015-06-20 MED ORDER — SERTRALINE HCL 50 MG PO TABS: 50.0000 mg | ORAL_TABLET | Freq: Every day | ORAL | Status: DC

## 2015-06-20 NOTE — Progress Notes (Signed)
   Subjective:    Patient ID: Kristina Harrington, female    DOB: 1981/05/10, 34 y.o.   MRN: [id number]  HPI Tore her ACL in April.  Then had surgery in July. Has had a delay in her PT.  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 And has been down bc of this.   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 Just found out her daughter has leukemia about 16 days ago. She feels like she is not herself. She feels depressed. Feeling unmotivated.  She was on Paxil one point when she was in college but was really to counter act the depressive effects of Adderall which she was taking at that time. She has not ever really been on medication specifically for depression. She did meet with a therapist a couple of times who she really likes she just hasn't been able to go back since her daughter was diagnosed with leukemia.   She does complain of little interest or pleasure doing things more than half the days and feeling down every day. She also complains of overeating and not taking good self-care. She has not had any thoughts of wanting to harm herself. She does feel nervous several days a week and feels like she worries excessively. She also complains of feeling irritable more than half the days. She rates her depressive symptoms as very difficult and her anxiety symptoms as somewhat difficult.   Review of Systems     Objective:   Physical Exam  Constitutional: She is oriented to person, place, and time. She appears well-developed and well-nourished.  HENT:  Head: Normocephalic and atraumatic.  Cardiovascular: Normal rate, regular rhythm and normal heart sounds.   Pulmonary/Chest: Effort normal and breath sounds normal.  Neurological: She is alert and oriented to person, place, and time.  Skin: Skin is warm and dry.  Psychiatric: She has a normal mood and affect. Her behavior is normal.          Assessment & Plan:  Acute situational depression - discussed medications and therapy.  PHQ 9 score  of 16 today and Gad 7 score of 8. her scores are consistent with severe depression and mild anxiety. we discussed several options. I think putting her on a medication for her. Of time would be very helpful for her. We'll start with Zoloft. I'll see her back in 2-3 weeks to see how she's doing and to adjust her medication. Discussed potential side effects. I strongly encouraged her to get back in with her therapist/counselor. I think in the long run this is be very helpful for her.   Flu vaccine given today.   Time spent about 25 min with about 50% spent counseling for depression

## 2015-07-11 ENCOUNTER — Encounter: Payer: Self-pay | Admitting: Family Medicine

## 2015-07-11 ENCOUNTER — Ambulatory Visit (INDEPENDENT_AMBULATORY_CARE_PROVIDER_SITE_OTHER): Payer: Managed Care, Other (non HMO) | Admitting: Family Medicine

## 2015-07-11 VITALS — BP 121/64 | HR 65 | Temp 98.7°F | Resp 18 | Wt 210.9 lb

## 2015-07-11 DIAGNOSIS — F430 Acute stress reaction: Secondary | ICD-10-CM

## 2015-07-11 DIAGNOSIS — Z13220 Encounter for screening for lipoid disorders: Secondary | ICD-10-CM

## 2015-07-11 DIAGNOSIS — Z114 Encounter for screening for human immunodeficiency virus [HIV]: Secondary | ICD-10-CM | POA: Diagnosis not present

## 2015-07-11 LAB — COMPLETE METABOLIC PANEL WITH GFR
ALBUMIN: 4.4 g/dL (ref 3.6–5.1)
ALK PHOS: 103 U/L (ref 33–115)
ALT: 18 U/L (ref 6–29)
AST: 15 U/L (ref 10–30)
BUN: 11 mg/dL (ref 7–25)
CALCIUM: 9.3 mg/dL (ref 8.6–10.2)
CHLORIDE: 103 mmol/L (ref 98–110)
CO2: 28 mmol/L (ref 20–31)
Creat: 0.71 mg/dL (ref 0.50–1.10)
Glucose, Bld: 87 mg/dL (ref 65–99)
POTASSIUM: 4.1 mmol/L (ref 3.5–5.3)
Sodium: 139 mmol/L (ref 135–146)
Total Bilirubin: 0.8 mg/dL (ref 0.2–1.2)
Total Protein: 7.2 g/dL (ref 6.1–8.1)

## 2015-07-11 LAB — LIPID PANEL
CHOL/HDL RATIO: 4.7 ratio (ref <=5.0)
CHOLESTEROL: 235 mg/dL — AB (ref 125–200)
HDL: 50 mg/dL (ref >=46)
LDL Cholesterol: 158 mg/dL — ABNORMAL HIGH (ref <130)
TRIGLYCERIDES: 137 mg/dL (ref <150)
VLDL: 27 mg/dL (ref <30)

## 2015-07-11 LAB — HIV ANTIBODY (ROUTINE TESTING W REFLEX): HIV 1&2 Ab, 4th Generation: NONREACTIVE

## 2015-07-11 NOTE — Progress Notes (Signed)
   Subjective:    Patient ID: Kristina Harrington, female    DOB: 1981/06/21, 34 y.o.   MRN: [id number]  HPI Depression - her daughter is now on remission but will have to have chemo for about 3 more years to help maintain remission.  Only had one days where she felt nauseated on the medication.  She has otherwise done well on the medication.  No other S.E. she says she is not feeling down at all. Energy levels and fatigue actually have been better. She says her husband even noticed a difference. It turns out she found out her husband is actually on the same medication for his anxiety.   Review of Systems     Objective:   Physical Exam  Constitutional: She is oriented to person, place, and time. She appears well-developed and well-nourished.  HENT:  Head: Normocephalic and atraumatic.  Cardiovascular: Normal rate, regular rhythm and normal heart sounds.   Pulmonary/Chest: Effort normal and breath sounds normal.  Neurological: She is alert and oriented to person, place, and time.  Skin: Skin is warm and dry.  Psychiatric: She has a normal mood and affect. Her behavior is normal.          Assessment & Plan:  Acute situational reaction/depression-PHQ 9 score of 3 today which is significantly improved from her previous score of 16. She rates her symptoms is somewhat difficult. Gad 7 score is 0 today. Her daughter is doing well on it sounds like she has a lot of support. She's not really had any major side effects with the medication. She said she will call me in a couple weeks if she decides she wants to go up on her dose. Otherwise I will see her back in 2 months.  Time spent 15 min, > 50% spent counseling about her stress/depression

## 2015-07-16 NOTE — Addendum Note (Signed)
Addended by: Deno EtienneBARKLEY, Ryan Ogborn L on: 07/16/2015 04:32 PM   Modules accepted: Orders

## 2015-07-22 ENCOUNTER — Telehealth: Payer: Self-pay | Admitting: *Deleted

## 2015-07-22 NOTE — Telephone Encounter (Signed)
Pt called and lvm asking that her zoloft be increased from 50 mg to 100 mg. She stated that this was discussed at last OV. Will fwd to pcp .Loralee PacasBarkley, Densil Ottey TahomaLynetta

## 2015-07-23 MED ORDER — SERTRALINE HCL 100 MG PO TABS: 100.0000 mg | ORAL_TABLET | Freq: Every day | ORAL | Status: DC

## 2015-07-23 NOTE — Telephone Encounter (Signed)
Prescription sent to [facility].

## 2015-07-24 NOTE — Telephone Encounter (Signed)
Pt has p/u rx.Kristina Harrington, Kristina Harrington Kristina Harrington

## 2015-08-05 ENCOUNTER — Ambulatory Visit (INDEPENDENT_AMBULATORY_CARE_PROVIDER_SITE_OTHER): Payer: Managed Care, Other (non HMO) | Admitting: Family Medicine

## 2015-08-05 ENCOUNTER — Encounter: Payer: Self-pay | Admitting: Family Medicine

## 2015-08-05 VITALS — BP 124/67 | HR 71 | Temp 98.1°F | Resp 18 | Wt 212.0 lb

## 2015-08-05 DIAGNOSIS — J309 Allergic rhinitis, unspecified: Secondary | ICD-10-CM

## 2015-08-05 DIAGNOSIS — H9201 Otalgia, right ear: Secondary | ICD-10-CM | POA: Diagnosis not present

## 2015-08-05 MED ORDER — PREDNISONE 20 MG PO TABS: 40.0000 mg | ORAL_TABLET | Freq: Every day | ORAL | Status: DC

## 2015-08-05 NOTE — Progress Notes (Signed)
   Subjective:    Patient ID: Kristina Harrington, female    DOB: [birth year], 34 y.o.   MRN: [id number]  HPI Ear Pain - Left ear pain with PND for 3 days.  No fever, chills.  Left side of throat hurts. Chest hurts some with deep breathing.  She was power washing her house about 4 days ago and was working outdoors.  She has also been dusting and cleaning her dogs area and she is allergic to dog hair.  She is on Dymista and odes really help.  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      Review of Systems     Objective:   Physical Exam  Constitutional: She is oriented to person, place, and time. She appears well-developed and well-nourished.  HENT:  Head: Normocephalic and atraumatic.  Right Ear: External ear normal.  Left Ear: External ear normal.  Nose: Nose normal.  Mouth/Throat: Oropharynx is clear and moist.  TMs and canals are clear.   Eyes: Conjunctivae and EOM are normal. Pupils are equal, round, and reactive to light.  Neck: Neck supple. No thyromegaly present.  Cardiovascular: Normal rate, regular rhythm and normal heart sounds.   Pulmonary/Chest: Effort normal and breath sounds normal. She has no wheezes.  Lymphadenopathy:    She has no cervical adenopathy.  Neurological: She is alert and oriented to person, place, and time.  Skin: Skin is warm and dry.  Psychiatric: She has a normal mood and affect.          Assessment & Plan:  Allergic rhinitis with right otalgia, right-I really think she's just having an moderate allergic response.  I don't see anything that looks bacterial on exam today. Encourage her call us if she feels like she's not improving or if she suddenly gets worse or develops a fever. Continue with  Routine allergy medications including her Dymista and her Singulair.

## 2015-08-26 ENCOUNTER — Ambulatory Visit (INDEPENDENT_AMBULATORY_CARE_PROVIDER_SITE_OTHER): Payer: Managed Care, Other (non HMO) | Admitting: Family Medicine

## 2015-08-26 ENCOUNTER — Encounter: Payer: Self-pay | Admitting: Family Medicine

## 2015-08-26 VITALS — BP 114/65 | HR 76 | Wt 217.0 lb

## 2015-08-26 DIAGNOSIS — F430 Acute stress reaction: Secondary | ICD-10-CM

## 2015-08-26 NOTE — Progress Notes (Signed)
   Subjective:    Patient ID: Kristina RoadsJamie Harrington, female    DOB: 1981-05-04, 34 y.o.   MRN: [id number]  HPI Follow-up acute situational stress-she's been doing very well on the Zoloft 50 mg tab. She called and wanted to increase her dose 200 mg. She's been on that dose for almost a month now. For a couple of weeks she feels shaky. Usually happens by mid-day. Eating doesn' t seem to help.  Sometimes feels like her insides are shaking and her hands shake.  We went up on her zoloft a couple of weeks ago.    She is finally going to be released for modified exercise from her PT from her knee.  His really excited about this because she loves working out and exercising and hasn't been able to for the last few months.  Review of Systems     Objective:   Physical Exam  Constitutional: She is oriented to person, place, and time. She appears well-developed and well-nourished.  HENT:  Head: Normocephalic and atraumatic.  Cardiovascular: Normal rate, regular rhythm and normal heart sounds.   Pulmonary/Chest: Effort normal and breath sounds normal.  Neurological: She is alert and oriented to person, place, and time.  Skin: Skin is warm and dry.  Psychiatric: She has a normal mood and affect. Her behavior is normal.          Assessment & Plan:  Acute situational reaction/depression - discussed optoins. We could continue with the Zoloft 100 mg for a few more weeks to see if the sensations subside. This often happens with this particular medication. We could also consider reducing the medication back down to 50 mg where she did not have symptoms and add something like Wellbutrin. Or we can switch the medication completely to a different drug. Right now she wants to continue with the Zoloft 100 mg for a few more weeks. She has noticed improvement in her mood and her husband has even noticed as well that she seems more like herself and is laughing again and more joyful n life. I'm also excited that she will be  able to exercise again. Think I think this will really help her mood as well. Follow-up in 2 months.  Time spent 20 minutes, REM 57 time spent counseling about acute situational reaction/depression.

## 2015-09-10 ENCOUNTER — Encounter: Payer: Self-pay | Admitting: Family Medicine

## 2015-09-10 ENCOUNTER — Ambulatory Visit (INDEPENDENT_AMBULATORY_CARE_PROVIDER_SITE_OTHER): Payer: Managed Care, Other (non HMO) | Admitting: Family Medicine

## 2015-09-10 VITALS — BP 114/67 | HR 72 | Wt 216.0 lb

## 2015-09-10 DIAGNOSIS — F430 Acute stress reaction: Secondary | ICD-10-CM | POA: Diagnosis not present

## 2015-09-10 NOTE — Progress Notes (Signed)
   Subjective:    Patient ID: Kristina RoadsJamie Harrington, female    DOB: [birth year], 34 y.o.   MRN: [id number]  HPI Kristina MuirJamie is here today for follow-up for acute stress reaction. I last saw her she was doing well on Zoloft we had recently increased her Desyrel 100 mg and she started noticing some shakiness midday. We discussed several options including changing the medication. We opted to continue it for a little bit longer to see if it is going to be equally effective and to see if the shakiness was going to wear off. She actually says it did eventually seemed to go away and she is doing well overall medication. She has felt more down in the last week. Her daughter is still neutropenic but will hopefully be able to get her next chemotherapy treatment next week. And they are having some family to come visit which she is excited about. She still complains of feeling down several days a week and difficulty with sleep. She rates her symptoms is somewhat difficult. She denies any other side effects from the medication.  Review of Systems     Objective:   Physical Exam  Constitutional: She is oriented to person, place, and time. She appears well-developed and well-nourished.  HENT:  Head: Normocephalic and atraumatic.  Cardiovascular: Normal rate, regular rhythm and normal heart sounds.   Pulmonary/Chest: Effort normal and breath sounds normal.  Neurological: She is alert and oriented to person, place, and time.  Skin: Skin is warm and dry.  Psychiatric: She has a normal mood and affect. Her behavior is normal.          Assessment & Plan:  Acute stress reaction-7 Score of 2. PH Q9 Score of 4. She Set Therapeutic Goal with Her Medication. She Does Have a Good Support System. Her Mother Is Particularly Helpful for Her and She Says She Talks to Her Almost Every Day. I Would like to See Her Back in 2-3 Months to Make Sure That She Is Still Doing Well.  Time spent 20 minutes, greater than 50% of time spent  counseling about acute stress.

## 2015-10-04 ENCOUNTER — Encounter: Payer: Self-pay | Admitting: Emergency Medicine

## 2015-10-04 ENCOUNTER — Emergency Department
Admission: EM | Admit: 2015-10-04 | Discharge: 2015-10-04 | Disposition: A | Discharge status: Home / Self Care | Payer: Managed Care, Other (non HMO) | Source: Home / Self Care | Attending: Family Medicine | Admitting: Family Medicine

## 2015-10-04 DIAGNOSIS — B9789 Other viral agents as the cause of diseases classified elsewhere: Principal | ICD-10-CM

## 2015-10-04 DIAGNOSIS — J029 Acute pharyngitis, unspecified: Secondary | ICD-10-CM | POA: Diagnosis not present

## 2015-10-04 DIAGNOSIS — J309 Allergic rhinitis, unspecified: Secondary | ICD-10-CM

## 2015-10-04 DIAGNOSIS — Z0110 Encounter for examination of ears and hearing without abnormal findings: Secondary | ICD-10-CM | POA: Diagnosis not present

## 2015-10-04 DIAGNOSIS — J069 Acute upper respiratory infection, unspecified: Secondary | ICD-10-CM | POA: Diagnosis not present

## 2015-10-04 LAB — POCT RAPID STREP A (OFFICE): RAPID STREP A SCREEN: NEGATIVE

## 2015-10-04 MED ORDER — PREDNISONE 20 MG PO TABS: 20.0000 mg | ORAL_TABLET | Freq: Two times a day (BID) | ORAL | Status: DC

## 2015-10-04 MED ORDER — AZITHROMYCIN 250 MG PO TABS: ORAL_TABLET | ORAL | Status: DC

## 2015-10-04 MED ORDER — BENZONATATE 200 MG PO CAPS: 200.0000 mg | ORAL_CAPSULE | Freq: Every day | ORAL | Status: DC

## 2015-10-04 NOTE — ED Notes (Signed)
Pt c/o left ear pain and white patches in her throat.

## 2015-10-04 NOTE — Discharge Instructions (Signed)
Take plain guaifenesin (  extended release tabs such as Mucinex) twice daily, with plenty of water, for cough and congestion.  May add Pseudoephedrine ( , one or two every 4 to 6 hours) for sinus congestion.  Get adequate rest.   May use Afrin nasal spray (or generic oxymetazoline) twice daily for about 5 days and then discontinue.  Also recommend using saline nasal spray several times daily and saline nasal irrigation (AYR is a common brand).  Use Dymista nasal spray each morning after using Afrin nasal spray and saline nasal irrigation. Try warm salt water gargles for sore throat.  Stop all antihistamines for now, and other non-prescription cough/cold preparations. Continue Singulair Begin Azithromycin if not improving about one week or if persistent fever develops  Follow-up with family doctor if not improving about10 days.

## 2015-10-04 NOTE — ED Provider Notes (Signed)
CSN: [id number]     Arrival date & time 10/04/15  0909 History   First MD Initiated Contact with Patient 10/04/15 0932     Chief Complaint  Patient presents with  . URI      HPI Comments: About one week ago patient developed typical cold-like symptoms including sinus congestion, fatigue, myalgias, chills, and cough.  Last night she developed a sore throat, and left ear feels clogged.  She has a history of seasonal allergies.  The history is provided by the patient.    History reviewed. No pertinent past medical history. Past Surgical History  Procedure Laterality Date  . Arthroscopic repair acl  03/2015  . Knee surgery Left     ACL   No family history on file. Social History  Substance Use Topics  . Smoking status: Never Smoker   . Smokeless tobacco: None  . Alcohol Use: Yes     Comment: 1-2 drinks a year   OB History    No data available     Review of Systems + sore throat No cough + sneezing No pleuritic pain No wheezing + nasal congestion + post-nasal drainage No sinus pain/pressure No itchy/red eyes ? earache No hemoptysis No SOB No fever, + chills No nausea No vomiting No abdominal pain No diarrhea No urinary symptoms No skin rash + fatigue + myalgias No headache Used OTC meds without relief  Allergies  Review of patient's allergies indicates no known allergies.  Home Medications   Prior to Admission medications   Medication Sig Start Date End Date Taking? Authorizing Provider  Azelastine-Fluticasone 137-50 MCG/ACT SUSP Place 1 spray into the nose 2 (two) times daily.    Historical Provider, MD  azithromycin (ZITHROMAX Z-PAK) 250 MG tablet Take 2 tabs today; then begin one tab once daily for 4 more days. (Rx void after 10/11/15) 10/04/15   Lattie Haw, MD  benzonatate (TESSALON) 200 MG capsule Take 1 capsule (200 mg total) by mouth at bedtime. Take as needed for cough 10/04/15   Lattie Haw, MD  loratadine (CLARITIN) 10 MG tablet Take 10 mg by  mouth daily.    Historical Provider, MD  montelukast (SINGULAIR) 10 MG tablet Take 10 mg by mouth at bedtime.    Historical Provider, MD  predniSONE (DELTASONE) 20 MG tablet Take 1 tablet (20 mg total) by mouth 2 (two) times daily. Take with food. 10/04/15   Lattie Haw, MD  sertraline (ZOLOFT) 100 MG tablet Take 1 tablet (100 mg total) by mouth daily. 07/23/15   Agapito Games, MD   Meds Ordered and Administered this Visit  Medications - No data to display  BP 125/80 mmHg  Pulse 77  Temp(Src) 98.5 F (36.9 C) (Oral)  Ht  (1.753 m)  Wt 222 lb (100.699 kg)  BMI 32.77 kg/m2  SpO2 95%  LMP 07/28/2015 (Exact Date) No data found.   Physical Exam Nursing notes and Vital Signs reviewed. Appearance:  Patient appears stated age, and in no acute distress Eyes:  Pupils are equal, round, and reactive to light and accomodation.  Extraocular movement is intact.  Conjunctivae are not inflamed  Ears:  Canals normal.  Right tympanic membrane normal; Left tympanic membrane has decreased landmarks. Nose:  Mildly congested turbinates.  No sinus tenderness.    Pharynx:  Normal Neck:  Supple.  Tender enlarged posterior nodes are palpated bilaterally  Lungs:  Clear to auscultation.  Breath sounds are equal.  Moving air well. Heart:  Regular rate  and rhythm without murmurs, rubs, or gallops.  Abdomen:  Nontender without masses or hepatosplenomegaly.  Bowel sounds are present.  No CVA or flank tenderness.  Extremities:  No edema.  Skin:  No rash present.   ED Course  Procedures  None     Labs Reviewed  STREP A DNA PROBE  POCT RAPID STREP A (OFFICE) negative   Tympanogram:  Normal both ears    MDM   1. Viral URI with cough   2. Acute pharyngitis, unspecified etiology   3. Allergic sinusitis    There is no evidence of bacterial infection today.  Treat symptomatically for now  Begin prednisone burst. Prescription written for Benzonatate (Tessalon) to take at bedtime for  night-time cough.  Take plain guaifenesin (  extended release tabs such as Mucinex) twice daily, with plenty of water, for cough and congestion.  May add Pseudoephedrine ( , one or two every 4 to 6 hours) for sinus congestion.  Get adequate rest.   May use Afrin nasal spray (or generic oxymetazoline) twice daily for about 5 days and then discontinue.  Also recommend using saline nasal spray several times daily and saline nasal irrigation (AYR is a common brand).  Use Dymista nasal spray each morning after using Afrin nasal spray and saline nasal irrigation. Try warm salt water gargles for sore throat.  Stop all antihistamines for now, and other non-prescription cough/cold preparations. Continue Singulair Begin Azithromycin if not improving about one week or if persistent fever develops (Given a prescription to hold, with an expiration date)  Follow-up with family doctor if not improving about10 days.     Lattie Haw, MD 10/04/15 1302

## 2015-10-21 ENCOUNTER — Other Ambulatory Visit: Payer: Self-pay

## 2015-10-21 MED ORDER — SERTRALINE HCL 100 MG PO TABS
100.0000 mg | ORAL_TABLET | Freq: Every day | ORAL | Status: DC
Start: 2015-10-21 — End: 2015-11-20

## 2015-10-30 ENCOUNTER — Ambulatory Visit: Payer: Managed Care, Other (non HMO) | Admitting: Family Medicine

## 2015-11-06 ENCOUNTER — Ambulatory Visit: Payer: Managed Care, Other (non HMO) | Admitting: Family Medicine

## 2015-11-20 ENCOUNTER — Ambulatory Visit (INDEPENDENT_AMBULATORY_CARE_PROVIDER_SITE_OTHER): Payer: Managed Care, Other (non HMO) | Admitting: Family Medicine

## 2015-11-20 ENCOUNTER — Encounter: Payer: Self-pay | Admitting: Family Medicine

## 2015-11-20 VITALS — BP 112/59 | HR 79 | Wt 230.0 lb

## 2015-11-20 DIAGNOSIS — F430 Acute stress reaction: Secondary | ICD-10-CM | POA: Diagnosis not present

## 2015-11-20 MED ORDER — SERTRALINE HCL 100 MG PO TABS: 100.0000 mg | ORAL_TABLET | Freq: Every day | ORAL | Status: DC

## 2015-11-20 NOTE — Progress Notes (Signed)
   Subjective:    Patient ID: Kristina Harrington, female    DOB: May 10, 1981, 35 y.o.   MRN: [id number]  HPI Here for f/u acute stress reaction - Overall she is doing fairly well. Her daughter is still undergoing chemotherapy. She is on a 6 week course of chemotherapy where she receives an injection 3-4 days per week. They have been able to do the injections at home which is also been helpful. Her sleep has been fair. She is actually been sleeping with her daughter for the last few weeks to help her daughter's anxiety. She admits she has been doing some stress eating and hasn't been able to exercise regularly. She feels that the Zoloft is working well. She denies any side effects with the medication.   Review of Systems     Objective:   Physical Exam  Constitutional: She is oriented to person, place, and time. She appears well-developed and well-nourished.  HENT:  Head: Normocephalic and atraumatic.  Cardiovascular: Normal rate, regular rhythm and normal heart sounds.   Pulmonary/Chest: Effort normal and breath sounds normal.  Neurological: She is alert and oriented to person, place, and time.  Skin: Skin is warm and dry.  Psychiatric: She has a normal mood and affect. Her behavior is normal.          Assessment & Plan:  Acute stress reaction-Ganz 7 score is 3 today, up a little bit from previous score of 2. PHQ 9 score of 6 today, up a little bit from previous of 4. Continue current regimen. Follow-up in 3 months. Call if any problems or concerns. Did encourage her to work on getting back into her routine exercise and running.  Time spent 20 minutes, greater than 50% of the time spent counseling about stress and anxiety.

## 2016-06-09 ENCOUNTER — Other Ambulatory Visit: Payer: Self-pay | Admitting: Family Medicine

## 2016-07-18 ENCOUNTER — Other Ambulatory Visit: Payer: Self-pay | Admitting: Family Medicine

## 2016-07-21 ENCOUNTER — Other Ambulatory Visit: Payer: Self-pay

## 2016-07-21 MED ORDER — SERTRALINE HCL 100 MG PO TABS: 100.0000 mg | ORAL_TABLET | Freq: Every day | ORAL | 0 refills | Status: DC

## 2016-07-27 ENCOUNTER — Ambulatory Visit: Payer: Managed Care, Other (non HMO) | Admitting: Family Medicine

## 2016-07-31 ENCOUNTER — Encounter: Payer: Self-pay | Admitting: Emergency Medicine

## 2016-07-31 ENCOUNTER — Emergency Department
Admission: EM | Admit: 2016-07-31 | Discharge: 2016-07-31 | Disposition: A | Discharge status: Home / Self Care | Payer: Managed Care, Other (non HMO) | Source: Home / Self Care | Attending: Family Medicine | Admitting: Family Medicine

## 2016-07-31 DIAGNOSIS — J019 Acute sinusitis, unspecified: Secondary | ICD-10-CM

## 2016-07-31 DIAGNOSIS — J040 Acute laryngitis: Secondary | ICD-10-CM

## 2016-07-31 DIAGNOSIS — H65193 Other acute nonsuppurative otitis media, bilateral: Secondary | ICD-10-CM

## 2016-07-31 MED ORDER — AMOXICILLIN-POT CLAVULANATE 875-125 MG PO TABS: 1.0000 | ORAL_TABLET | Freq: Two times a day (BID) | ORAL | 0 refills | Status: DC

## 2016-07-31 MED ORDER — PREDNISONE 20 MG PO TABS: ORAL_TABLET | ORAL | 0 refills | Status: DC

## 2016-07-31 NOTE — ED Triage Notes (Signed)
Pt c/o bi lateral ear pain and sore throat x2 days.

## 2016-07-31 NOTE — ED Provider Notes (Signed)
CSN: [id number]     Arrival date & time 07/31/16  0907 History   First MD Initiated Contact with Patient 07/31/16 [phone]     No chief complaint on file.  (Consider location/radiation/quality/duration/timing/severity/associated sxs/prior Treatment) HPI Kristina Harrington is a 35 y.o. female presenting to UC with c/o 2 days of worsening sinus pain and pressure, bilateral ear pain, and hoarse voice.  Minimally productive cough.  She has used a sinus rinse and taken her daily singulair but has not taken any mucinex or other OTC cough/cold medications.  She has a hx of recurrent sinus infections and also notes her daughter is a cancer patient so she hopes to not get her sick.  Subjective fever earlier today.  Denies n/v/d.    No past medical history on file. Past Surgical History:  Procedure Laterality Date  . ARTHROSCOPIC REPAIR ACL  03/2015  . KNEE SURGERY Left    ACL   No family history on file. Social History  Substance Use Topics  . Smoking status: Never Smoker  . Smokeless tobacco: Not on file  . Alcohol use Yes     Comment: 1-2 drinks a year   OB History    No data available     Review of Systems  Constitutional: Positive for fever ( subjective). Negative for chills.  HENT: Positive for congestion, ear pain (Left > Right), sinus pain, sinus pressure and sore throat. Negative for trouble swallowing and voice change.   Respiratory: Positive for cough. Negative for shortness of breath.   Cardiovascular: Negative for chest pain and palpitations.  Gastrointestinal: Negative for abdominal pain, diarrhea, nausea and vomiting.  Musculoskeletal: Negative for arthralgias, back pain and myalgias.  Skin: Negative for rash.  Neurological: Positive for headaches. Negative for dizziness and light-headedness.    Allergies  Patient has no known allergies.  Home Medications   Prior to Admission medications   Medication Sig Start Date End Date Taking? Authorizing Provider   amoxicillin-clavulanate (AUGMENTIN) 875-125 MG tablet Take 1 tablet by mouth 2 (two) times daily. One po bid x 7 days 07/31/16   Kristina FinnerErin O'Malley, PA-C  Azelastine-Fluticasone 137-50 MCG/ACT SUSP Place 1 spray into the nose 2 (two) times daily.    Historical Provider, MD  loratadine (CLARITIN) 10 MG tablet Take 10 mg by mouth daily.    Historical Provider, MD  montelukast (SINGULAIR) 10 MG tablet Take 10 mg by mouth at bedtime.    Historical Provider, MD  predniSONE (DELTASONE) 20 MG tablet 3 tabs po day one, then 2 po daily x 4 days 07/31/16   Kristina FinnerErin O'Malley, PA-C  sertraline (ZOLOFT) 100 MG tablet Take 1 tablet (100 mg total) by mouth daily. NEED FOLLOW UP APPOINTMENT FOR MORE REFILLS 07/21/16   Agapito Gamesatherine D Metheney, MD   Meds Ordered and Administered this Visit  Medications - No data to display  There were no vitals taken for this visit. No data found.   Physical Exam  Constitutional: She appears well-developed and well-nourished. No distress.  HENT:  Head: Normocephalic and atraumatic.  Right Ear: Tympanic membrane is injected. A middle ear effusion is present.  Left Ear: Tympanic membrane is erythematous. A middle ear effusion is present.  Nose: Mucosal edema present. Right sinus exhibits maxillary sinus tenderness and frontal sinus tenderness. Left sinus exhibits maxillary sinus tenderness and frontal sinus tenderness.  Mouth/Throat: Uvula is midline and mucous membranes are normal. Posterior oropharyngeal erythema present. No oropharyngeal exudate, posterior oropharyngeal edema or tonsillar abscesses.  Eyes: Conjunctivae are normal. No scleral  icterus.  Neck: Normal range of motion. Neck supple.  Hoarse voice but no stridor  Cardiovascular: Normal rate, regular rhythm and normal heart sounds.   Pulmonary/Chest: Effort normal and breath sounds normal. No stridor. No respiratory distress. She has no wheezes. She has no rales.  Abdominal: Soft. She exhibits no distension. There is no  tenderness.  Musculoskeletal: Normal range of motion.  Lymphadenopathy:    She has no cervical adenopathy.  Neurological: She is alert.  Skin: Skin is warm and dry. She is not diaphoretic.  Nursing note and vitals reviewed.   Urgent Care Course   Clinical Course     Procedures (including critical care time)  Labs Review Labs Reviewed - No data to display  Imaging Review No results found.   MDM   1. Acute middle ear effusion, bilateral   2. Laryngitis   3. Acute rhinosinusitis    Pt c/o bilateral pain and pressure with sinus congestion. Bilateral ears are erythematous with middle ear effusion present. Sinus tenderness on exam as well as hoarse voice.  Due to ear exam and sinus tenderness, will start pt on Augmentin and Prednisone. Encouraged to continue sinus rinses. F/u with PCP in 1 week if not improving, sooner if worsening. Patient verbalized understanding and agreement with treatment plan.     Kristina FinnerErin O'Malley, PA-C 07/31/16 [phone]

## 2016-08-02 ENCOUNTER — Encounter: Payer: Self-pay | Admitting: Family Medicine

## 2016-08-02 ENCOUNTER — Ambulatory Visit (INDEPENDENT_AMBULATORY_CARE_PROVIDER_SITE_OTHER): Payer: Managed Care, Other (non HMO) | Admitting: Family Medicine

## 2016-08-02 VITALS — BP 124/76 | HR 87 | Ht 69.0 in | Wt 238.0 lb

## 2016-08-02 DIAGNOSIS — R945 Abnormal results of liver function studies: Secondary | ICD-10-CM

## 2016-08-02 DIAGNOSIS — E7800 Pure hypercholesterolemia, unspecified: Secondary | ICD-10-CM

## 2016-08-02 DIAGNOSIS — R7989 Other specified abnormal findings of blood chemistry: Secondary | ICD-10-CM

## 2016-08-02 DIAGNOSIS — F430 Acute stress reaction: Secondary | ICD-10-CM

## 2016-08-02 DIAGNOSIS — E785 Hyperlipidemia, unspecified: Secondary | ICD-10-CM | POA: Insufficient documentation

## 2016-08-02 HISTORY — DX: Hyperlipidemia, unspecified: E78.5

## 2016-08-02 MED ORDER — SERTRALINE HCL 100 MG PO TABS: 100.0000 mg | ORAL_TABLET | Freq: Every day | ORAL | 1 refills | Status: DC

## 2016-08-02 NOTE — Progress Notes (Signed)
   Subjective:    Patient ID: Kristina Harrington, female    DOB: 11/19/1980, 35 y.o.   MRN: [id number]  HPI F/U acute stress reaction.  Her daughter was dx with leukemia. She is now on maintenance therapy with spinal infusoions every 90 days.  This is her 6 months f/u.  On zoloft 100mg  QD. Last GAD- 7 score of 3.   She plans on starting to work on losing weight. She actually lost about 7 pounds and her daughter and back in the hospital because her blood cell counts dropped. Therapy but stopped her exercise as well as her healthy diet that she had been working on. But she plans on getting back on track. Mother-in-law sexual getting married next month and she wants to lose weight before that.   Review of Systems     Objective:   Physical Exam  Constitutional: She is oriented to person, place, and time. She appears well-developed and well-nourished.  HENT:  Head: Normocephalic and atraumatic.  Cardiovascular: Normal rate, regular rhythm and normal heart sounds.   Pulmonary/Chest: Effort normal and breath sounds normal.  Neurological: She is alert and oriented to person, place, and time.  Skin: Skin is warm and dry.  Psychiatric: She has a normal mood and affect. Her behavior is normal.       Assessment & Plan:  acute stress reaction - GAD- 7 score of 2. Continue to work on reducing stress. Continue current regimen. Follow-up in 6 months.  Hyperlipidemia-due for CMP and lipids. Due to recheck. Work Radiographer, therapeuticondiet and exercise.

## 2016-08-04 ENCOUNTER — Encounter: Payer: Self-pay | Admitting: Family Medicine

## 2016-08-04 ENCOUNTER — Ambulatory Visit (INDEPENDENT_AMBULATORY_CARE_PROVIDER_SITE_OTHER): Payer: Managed Care, Other (non HMO) | Admitting: Family Medicine

## 2016-08-04 VITALS — BP 122/71 | HR 84 | Temp 98.3°F | Ht 69.0 in | Wt 238.0 lb

## 2016-08-04 DIAGNOSIS — J0100 Acute maxillary sinusitis, unspecified: Secondary | ICD-10-CM

## 2016-08-04 MED ORDER — LEVOFLOXACIN 500 MG PO TABS: 500.0000 mg | ORAL_TABLET | Freq: Every day | ORAL | 0 refills | Status: AC

## 2016-08-04 MED ORDER — BENZONATATE 200 MG PO CAPS: 200.0000 mg | ORAL_CAPSULE | Freq: Two times a day (BID) | ORAL | 0 refills | Status: DC | PRN

## 2016-08-04 NOTE — Patient Instructions (Signed)
Stop the Augmentin and start the levaquin  Can use Delsym cough syrup at bedtime.  Can use the tessalon perles during the daytime for cough

## 2016-08-04 NOTE — Progress Notes (Signed)
   Subjective:    Patient ID: Kristina Harrington, female    DOB: 15-Oct-1980, 35 y.o.   MRN: [id number]  HPI [age] you female was seen in urgent care 5 days ago on November 18 after starting coughing and nasal congestion after grooming her dog (she is allergic to dog dander).   She has been taking her Singulair. She was diagnosed with an otitis medial with effusion and laryngitis. She was placed on Augmentin and prednisone. She called earlier this morning saying that she is not feeling better in fact she actually feels worse. She has now developed chest symptoms including cough. She is not taking anything for cough.  She is getting a lot of facial pressure in her right maxillary sinus radiating to her right ear. This developed over the last 2 days.   Review of Systems     Objective:   Physical Exam  Constitutional: She is oriented to person, place, and time. She appears well-developed and well-nourished.  HENT:  Head: Normocephalic and atraumatic.  Right Ear: External ear normal.  Left Ear: External ear normal.  Nose: Nose normal.  Mouth/Throat: Oropharynx is clear and moist.  TMs and canals are clear.   Eyes: Conjunctivae and EOM are normal. Pupils are equal, round, and reactive to light.  Neck: Neck supple. No thyromegaly present.  Cardiovascular: Normal rate, regular rhythm and normal heart sounds.   Pulmonary/Chest: Effort normal and breath sounds normal. She has no wheezes.  Lymphadenopathy:    She has no cervical adenopathy.  Neurological: She is alert and oriented to person, place, and time.  Skin: Skin is warm and dry.  Psychiatric: She has a normal mood and affect.       Assessment & Plan:  Acute right maxillary sinusitis - Discontinue Augmentin and switch to Levaquin. Finish up the prednisone that she has. Also give her Jerilynn Somessalon Perles use during the day and recommend up from cough syrup for bedtime. If she's not improving by Monday of next week and please give a call back.

## 2016-08-16 ENCOUNTER — Encounter: Payer: Self-pay | Admitting: Family Medicine

## 2016-08-16 ENCOUNTER — Ambulatory Visit (INDEPENDENT_AMBULATORY_CARE_PROVIDER_SITE_OTHER): Payer: Managed Care, Other (non HMO) | Admitting: Family Medicine

## 2016-08-16 VITALS — BP 139/70 | HR 132 | Temp 97.9°F

## 2016-08-16 DIAGNOSIS — A084 Viral intestinal infection, unspecified: Secondary | ICD-10-CM

## 2016-08-16 MED ORDER — PROMETHAZINE HCL 25 MG PO TABS: 25.0000 mg | ORAL_TABLET | Freq: Three times a day (TID) | ORAL | 0 refills | Status: DC | PRN

## 2016-08-16 MED ORDER — PROMETHAZINE HCL 25 MG/ML IJ SOLN
25.0000 mg | Freq: Once | INTRAMUSCULAR | Status: AC
  Administered 2016-08-16: 25 mg via INTRAMUSCULAR

## 2016-08-16 MED ORDER — KETOROLAC TROMETHAMINE 60 MG/2ML IM SOLN
60.0000 mg | Freq: Once | INTRAMUSCULAR | Status: AC
  Administered 2016-08-16: 60 mg via INTRAMUSCULAR

## 2016-08-16 MED ORDER — PROMETHAZINE HCL 25 MG RE SUPP: 25.0000 mg | Freq: Four times a day (QID) | RECTAL | 0 refills | Status: DC | PRN

## 2016-08-16 NOTE — Patient Instructions (Addendum)
Viral Gastroenteritis, Adult Introduction Viral gastroenteritis is also known as the stomach flu. This condition is caused by certain germs (viruses). These germs can be passed from person to person very easily (are very contagious). This condition can cause sudden watery poop (diarrhea), fever, and throwing up (vomiting). Having watery poop and throwing up can make you feel weak and cause you to get dehydrated. Dehydration can make you tired and thirsty, make you have a dry mouth, and make it so you pee (urinate) less often. Older adults and people with other diseases or a weak defense system (immune system) are at higher risk for dehydration. It is important to replace the fluids that you lose from having watery poop and throwing up. Follow these instructions at home: Follow instructions from your doctor about how to care for yourself at home. Eating and drinking Follow these instructions as told by your doctor:  Take an oral rehydration solution (ORS). This is a drink that is sold at pharmacies and stores.  Drink clear fluids in small amounts as you are able, such as:  Water.  Ice chips.  Diluted fruit juice.  Low-calorie sports drinks.  Eat bland, easy-to-digest foods in small amounts as you are able, such as:  Bananas.  Applesauce.  Rice.  Low-fat (lean) meats.  Toast.  Crackers.  Avoid fluids that have a lot of sugar or caffeine in them.  Avoid alcohol.  Avoid spicy or fatty foods. General instructions  Drink enough fluid to keep your pee (urine) clear or pale yellow.  Wash your hands often. If you cannot use soap and water, use hand sanitizer.  Make sure that all people in your home wash their hands well and often.  Rest at home while you get better.  Take over-the-counter and prescription medicines only as told by your doctor.  Watch your condition for any changes.  Take a warm bath to help with any burning or pain from having watery poop.  Keep all  follow-up visits as told by your doctor. This is important. Contact a doctor if:  You cannot keep fluids down.  Your symptoms get worse.  You have new symptoms.  You feel light-headed or dizzy.  You have muscle cramps. Get help right away if:  You have chest pain.  You feel very weak or you pass out (faint).  You see blood in your throw-up.  Your throw-up looks like coffee grounds.  You have bloody or black poop (stools) or poop that look like tar.  You have a very bad headache, a stiff neck, or both.  You have a rash.  You have very bad pain, cramping, or bloating in your belly (abdomen).  You have trouble breathing.  You are breathing very quickly.  Your heart is beating very quickly.  Your skin feels cold and clammy.  You feel confused.  You have pain when you pee.  You have signs of dehydration, such as:  Dark pee, hardly any pee, or no pee.  Cracked lips.  Dry mouth.  Sunken eyes.  Sleepiness.  Weakness. This information is not intended to replace advice given to you by your health care provider. Make sure you discuss any questions you have with your health care provider. Document Released: 02/16/2008 Document Revised: 03/19/2016 Document Reviewed: 05/06/2015  2017 Elsevier  

## 2016-08-16 NOTE — Progress Notes (Signed)
   Subjective:    Patient ID: Kristina Harrington, female    DOB: October 05, 1980, 35 y.o.   MRN: [id number]  HPI Started with vomiting and abdominal cramps around 1 AM. She has not had any diarrhea with it. She is afebrile this morning. She's not had anything to eat or drink since then.  Her husband driving is here with her today. No one else in the house has been sick. It started very suddenly overnight. She has had some diarrhea this morning. She's been getting sweats with the abdominal cramping and then chills. She says the abdominal cramping is severe. It will then eased off.   Review of Systems     Objective:   Physical Exam  Constitutional: She is oriented to person, place, and time. She appears well-developed and well-nourished.  HENT:  Head: Normocephalic and atraumatic.  Right Ear: External ear normal.  Left Ear: External ear normal.  Nose: Nose normal.  Mouth/Throat: Oropharynx is clear and moist.  TMs and canals are clear.   Eyes: Conjunctivae and EOM are normal. Pupils are equal, round, and reactive to light.  Neck: Neck supple. No thyromegaly present.  Cardiovascular: Normal rate, regular rhythm and normal heart sounds.   Pulmonary/Chest: Effort normal and breath sounds normal. She has no wheezes.  Abdominal: Soft. Bowel sounds are normal. She exhibits no distension and no mass. There is tenderness. There is no rebound and no guarding.  Tenderness in the epigastric area and RUQ and LUQ.    Lymphadenopathy:    She has no cervical adenopathy.  Neurological: She is alert and oriented to person, place, and time.  Skin: Skin is warm and dry.  Psychiatric: She has a normal mood and affect. Her behavior is normal.        Assessment & Plan:  Viral gastroenteritis-Symptoms most consistent with viral gastroenteritis. Given IM Phenergan/toradol here today in the office for daily. Lisinopril prescription for oral and rectal Phenergan. Explained it can usually last week 4-48 hours. Make  sure to clean well to not expose her daughter who is currently undergoing chemotherapy treatments.

## 2016-08-16 NOTE — Addendum Note (Signed)
Addended by: Deno EtienneBARKLEY, Dannielle Baskins L on: 08/16/2016 12:02 PM   Modules accepted: Orders

## 2016-08-18 ENCOUNTER — Telehealth: Payer: Self-pay

## 2016-08-18 DIAGNOSIS — R197 Diarrhea, unspecified: Secondary | ICD-10-CM

## 2016-08-18 NOTE — Telephone Encounter (Signed)
Pt husband reports that wife has stopped vomiting.  She woke up this morning with a fever of 104 with diarrhea. He wants to know what could he do to help her. Please advise.

## 2016-08-18 NOTE — Telephone Encounter (Signed)
Pt.notified

## 2016-08-18 NOTE — Telephone Encounter (Signed)
Let'; have her go for CBC w/ diff.  REally push the fluids since no longer vomiting. Are trying to introduce normal foods that stay way from anything greasy or with a lot of dairy. The quicker she gets back to eating regular food the quicker the diarrhea usually resolves.

## 2016-08-19 LAB — CBC WITH DIFFERENTIAL/PLATELET
BASOS ABS: 0 {cells}/uL (ref 0–200)
Basophils Relative: 0 %
EOS ABS: 55 {cells}/uL (ref 15–500)
Eosinophils Relative: 1 %
HEMATOCRIT: 40.3 % (ref 35.0–45.0)
Hemoglobin: 14.3 g/dL (ref 11.7–15.5)
LYMPHS PCT: 33 %
Lymphs Abs: 1815 cells/uL (ref 850–3900)
MCH: 33.3 pg — AB (ref 27.0–33.0)
MCHC: 35.5 g/dL (ref 32.0–36.0)
MCV: 93.7 fL (ref 80.0–100.0)
MONOS PCT: 7 %
MPV: 11.5 fL (ref 7.5–12.5)
Monocytes Absolute: 385 cells/uL (ref 200–950)
Neutro Abs: 3245 cells/uL (ref 1500–7800)
Neutrophils Relative %: 59 %
PLATELETS: 214 10*3/uL (ref 140–400)
RBC: 4.3 MIL/uL (ref 3.80–5.10)
RDW: 12.7 % (ref 11.0–15.0)
WBC: 5.5 10*3/uL (ref 3.8–10.8)

## 2016-08-20 LAB — COMPLETE METABOLIC PANEL WITH GFR
ALT: 51 U/L — AB (ref 6–29)
AST: 34 U/L — AB (ref 10–30)
Albumin: 3.9 g/dL (ref 3.6–5.1)
Alkaline Phosphatase: 121 U/L — ABNORMAL HIGH (ref 33–115)
BUN: 9 mg/dL (ref 7–25)
CALCIUM: 9.1 mg/dL (ref 8.6–10.2)
CHLORIDE: 103 mmol/L (ref 98–110)
CO2: 27 mmol/L (ref 20–31)
CREATININE: 0.69 mg/dL (ref 0.50–1.10)
GFR, Est Non African American: >89 mL/min (ref >=60)
Glucose, Bld: 109 mg/dL — ABNORMAL HIGH (ref 65–99)
POTASSIUM: 3.8 mmol/L (ref 3.5–5.3)
Sodium: 139 mmol/L (ref 135–146)
Total Bilirubin: 1 mg/dL (ref 0.2–1.2)
Total Protein: 6.9 g/dL (ref 6.1–8.1)

## 2016-08-20 LAB — LIPID PANEL
CHOL/HDL RATIO: 5.1 ratio — AB (ref <5.0)
CHOLESTEROL: 230 mg/dL — AB (ref <200)
HDL: 45 mg/dL — ABNORMAL LOW (ref >50)
LDL CALC: 152 mg/dL — AB (ref <100)
TRIGLYCERIDES: 164 mg/dL — AB (ref <150)
VLDL: 33 mg/dL — AB (ref <30)

## 2016-08-20 NOTE — Addendum Note (Signed)
Addended by: Deno EtienneBARKLEY, Apoorva Bugay L on: 08/20/2016 09:47 AM   Modules accepted: Orders

## 2016-10-08 LAB — HM PAP SMEAR: HM Pap smear: NEGATIVE

## 2016-10-08 LAB — RESULTS CONSOLE HPV: CHL HPV: NEGATIVE

## 2016-11-08 ENCOUNTER — Other Ambulatory Visit: Payer: Self-pay

## 2016-11-08 NOTE — Telephone Encounter (Signed)
Ok to send in zpack. Not sure of her pharmacy.   Nani Gasseratherine Metheney, MD

## 2016-11-10 ENCOUNTER — Ambulatory Visit: Payer: Managed Care, Other (non HMO) | Admitting: Family Medicine

## 2016-11-10 MED ORDER — AZITHROMYCIN 250 MG PO TABS: ORAL_TABLET | ORAL | 0 refills | Status: DC

## 2016-11-10 NOTE — Telephone Encounter (Signed)
Patient advised and medication sent in. 

## 2016-11-11 ENCOUNTER — Ambulatory Visit (INDEPENDENT_AMBULATORY_CARE_PROVIDER_SITE_OTHER): Payer: Managed Care, Other (non HMO) | Admitting: Family Medicine

## 2016-11-11 ENCOUNTER — Other Ambulatory Visit: Payer: Self-pay | Admitting: Family Medicine

## 2016-11-11 ENCOUNTER — Encounter: Payer: Self-pay | Admitting: Family Medicine

## 2016-11-11 VITALS — BP 122/83 | HR 94 | Temp 98.2°F | Wt 241.0 lb

## 2016-11-11 DIAGNOSIS — J301 Allergic rhinitis due to pollen: Secondary | ICD-10-CM

## 2016-11-11 DIAGNOSIS — J209 Acute bronchitis, unspecified: Secondary | ICD-10-CM

## 2016-11-11 MED ORDER — SERTRALINE HCL 100 MG PO TABS: 100.0000 mg | ORAL_TABLET | Freq: Every day | ORAL | 1 refills | Status: DC

## 2016-11-11 MED ORDER — PREDNISONE 20 MG PO TABS: 40.0000 mg | ORAL_TABLET | Freq: Every day | ORAL | 0 refills | Status: DC

## 2016-11-11 NOTE — Progress Notes (Signed)
Subjective:    CC: Chest pressure  HPI:  36 year old otherwise healthy female comes in today complaining of some chest pain with cough. She has been sick for about 5 days. [place] lot of pollen which she is very sensitive to and started with cough and nasal congestion. She thinks she's been running a low-grade temperature but says she did have an accurate thermometer with her on the trip. No GI symptoms. She is currently taking Delsym and just started azithromycin yesterday.. It had viral gastroenteritis in December, proximally 2 months ago.  .   Objective:    General: Well Developed, well nourished, and in no acute distress.  Neuro: Alert and oriented x3, extra-ocular muscles intact, sensation grossly intact.  HEENT: Normocephalic, atraumaticOrifices clear, TMs and canals are clear bilaterally. No significant cervical lymphadenopathy.  Skin: Warm and dry, no rashes. Cardiac: Regular rate and rhythm, no murmurs rubs or gallops, no lower extremity edema.  Respiratory: Clear to auscultation bilaterally. Not using accessory muscles, speaking in full sentences.   Impression and Recommendations:    Acute Bronchitis/allergic rhinitis-continue the Z-Pak and will add prednisone. Cover the allergic component. If not better in one week and please give us call back. Rest and make sure hydrating well.

## 2016-11-12 ENCOUNTER — Ambulatory Visit: Payer: Managed Care, Other (non HMO) | Admitting: Family Medicine

## 2017-05-18 ENCOUNTER — Ambulatory Visit (INDEPENDENT_AMBULATORY_CARE_PROVIDER_SITE_OTHER): Payer: Managed Care, Other (non HMO) | Admitting: Physician Assistant

## 2017-05-18 ENCOUNTER — Encounter: Payer: Self-pay | Admitting: Physician Assistant

## 2017-05-18 VITALS — BP 120/82 | HR 85 | Temp 98.5°F | Ht 69.0 in | Wt 248.0 lb

## 2017-05-18 DIAGNOSIS — J019 Acute sinusitis, unspecified: Secondary | ICD-10-CM

## 2017-05-18 NOTE — Progress Notes (Signed)
HPI:                                                                Kristina Harrington is a 36 y.o. female [facility] today for sinus congestion  Patient with PMH of allergic rhinitis reports sinus congestion, itchy ears, and sore throat for 3 days. Reports chills this morning. Denies fever, myalgias, cough, shortness of breath. No known sick contacts. She has been using her netty pot, compounded nasal spray, antihistamine and singulair.  Past Medical History:  Diagnosis Date  . Allergic rhinitis 12/11/2014   Dymista, singulair, claritin.    [name]. Hyperlipidemia 08/02/2016  . Lumbar degenerative disc disease 09/20/2013   Past Surgical History:  Procedure Laterality Date  . ARTHROSCOPIC REPAIR ACL  03/2015  . KNEE SURGERY Left    ACL   Social History  Substance Use Topics  . Smoking status: Never Smoker  . Smokeless tobacco: Never Used  . Alcohol use Yes     Comment: 1-2 drinks a year   family history is not on file.  ROS: negative except as noted in the HPI  Medications: Current Outpatient Prescriptions  Medication Sig Dispense Refill  . Azelastine HCl 0.15 % SOLN     . CAMRESE 0.15-0.03 &0.01 MG tablet     . loratadine (CLARITIN) 10 MG tablet Take 10 mg by mouth daily.    . montelukast (SINGULAIR) 10 MG tablet Take 10 mg by mouth at bedtime.    . sertraline (ZOLOFT) 100 MG tablet Take 1 tablet (100 mg total) by mouth daily. 90 tablet 1   No current facility-administered medications for this visit.    No Known Allergies     Objective:  BP 120/82   Pulse 85   Temp 98.5 F (36.9 C) (Oral)   Ht 5\' 9"  (1.753 m)   Wt 248 lb (112.5 kg)   BMI 36.62 kg/m  Gen: well-groomed, cooperative, not ill-appearing, no distress HEENT: normal conjunctiva, TM's clear bilaterally, nasal mucosa edematous, oropharynx with erythema, no tonsillar swelling or exudates, uvula midline, moist mucus membranes, neck supple, trachea  midline Pulm: Normal work of breathing, normal phonation, clear to auscultation bilaterally, no wheezes, rales or rhonchi CV: Normal rate, regular rhythm, s1 and s2 distinct, no murmurs, clicks or rubs  MSK: extremities atraumatic, normal gait and station Lymph: no cervical or tonsillar adenopathy Skin: warm, dry, intact; no rashes on exposed skin, no cyanosis   No results found for this or any previous visit (from the past 72 hour(s)). No results found.    Assessment and Plan: 36 y.o. female with   1. Acute non-recurrent sinusitis, unspecified location - likely viral etiology. Centor score 2 - symptomatic management with oral decongestant and netty pot  Patient education and anticipatory guidance given Patient agrees with treatment plan Follow-up as needed if symptoms worsen or fail to improve  Levonne Hubertharley E. Johnatan Baskette PA-C

## 2017-05-18 NOTE — Patient Instructions (Signed)

## 2017-05-25 ENCOUNTER — Telehealth: Payer: Self-pay | Admitting: *Deleted

## 2017-05-25 NOTE — Telephone Encounter (Signed)
Pt lvm stating that she is feeling congested, and has a cough and wanted to know what she can take.Laureen Ochs.[place], [name]

## 2017-05-25 NOTE — Telephone Encounter (Signed)
Called pt back and spoke with her husband and advised him to tell her to take/try mucinex , delsym. I asked him if she was running a fever? He stated that she was not. Kristina Harrington.[name], [name]

## 2017-07-28 ENCOUNTER — Encounter: Payer: Self-pay | Admitting: Physician Assistant

## 2017-07-28 ENCOUNTER — Ambulatory Visit: Payer: Managed Care, Other (non HMO) | Admitting: Physician Assistant

## 2017-07-28 VITALS — BP 121/82 | HR 89 | Temp 98.0°F | Ht 69.0 in | Wt 255.0 lb

## 2017-07-28 DIAGNOSIS — J060 Acute laryngopharyngitis: Secondary | ICD-10-CM

## 2017-07-28 LAB — POCT RAPID STREP A (OFFICE): RAPID STREP A SCREEN: NEGATIVE

## 2017-07-28 NOTE — Patient Instructions (Addendum)
- Sudafed or Phenylephrine for nasal congestion - follow instructions on package - Netty pot/saline washes - Tylenol 1000 mg every 8 hours, alternate with Ibuprofen 600 mg every 6 hours for throat pain, headaches - Warm, salt water gargles - Drink plenty of clear liquids, at least 1 liter. Avoid sodas and sweetened beverages - Rest   Sinusitis, Adult Sinusitis is soreness and inflammation of your sinuses. Sinuses are hollow spaces in the bones around your face. Your sinuses are located:  Around your eyes.  In the middle of your forehead.  Behind your nose.  In your cheekbones.  Your sinuses and nasal passages are lined with a stringy fluid (mucus). Mucus normally drains out of your sinuses. When your nasal tissues become inflamed or swollen, the mucus can become trapped or blocked so air cannot flow through your sinuses. This allows bacteria, viruses, and funguses to grow, which leads to infection. Sinusitis can develop quickly and last for 7?10 days (acute) or for more than 12 weeks (chronic). Sinusitis often develops after a cold. What are the causes? This condition is caused by anything that creates swelling in the sinuses or stops mucus from draining, including:  Allergies.  Asthma.  Bacterial or viral infection.  Abnormally shaped bones between the nasal passages.  Nasal growths that contain mucus (nasal polyps).  Narrow sinus openings.  Pollutants, such as chemicals or irritants in the air.  A foreign object stuck in the nose.  A fungal infection. This is rare.  What increases the risk? The following factors may make you more likely to develop this condition:  Having allergies or asthma.  Having had a recent cold or respiratory tract infection.  Having structural deformities or blockages in your nose or sinuses.  Having a weak immune system.  Doing a lot of swimming or diving.  Overusing nasal sprays.  Smoking.  What are the signs or symptoms? The  main symptoms of this condition are pain and a feeling of pressure around the affected sinuses. Other symptoms include:  Upper toothache.  Earache.  Headache.  Bad breath.  Decreased sense of smell and taste.  A cough that may get worse at night.  Fatigue.  Fever.  Thick drainage from your nose. The drainage is often green and it may contain pus (purulent).  Stuffy nose or congestion.  Postnasal drip. This is when extra mucus collects in the throat or back of the nose.  Swelling and warmth over the affected sinuses.  Sore throat.  Sensitivity to light.  How is this diagnosed? This condition is diagnosed based on symptoms, a medical history, and a physical exam. To find out if your condition is acute or chronic, your health care provider may:  Look in your nose for signs of nasal polyps.  Tap over the affected sinus to check for signs of infection.  View the inside of your sinuses using an imaging device that has a light attached (endoscope).  If your health care provider suspects that you have chronic sinusitis, you may also:  Be tested for allergies.  Have a sample of mucus taken from your nose (nasal culture) and checked for bacteria.  Have a mucus sample examined to see if your sinusitis is related to an allergy.  If your sinusitis does not respond to treatment and it lasts longer than 8 weeks, you may have an MRI or CT scan to check your sinuses. These scans also help to determine how severe your infection is. In rare cases, a bone biopsy  may be done to rule out more serious types of fungal sinus disease. How is this treated? Treatment for sinusitis depends on the cause and whether your condition is chronic or acute. If a virus is causing your sinusitis, your symptoms will go away on their own within 10 days. You may be given medicines to relieve your symptoms, including:  Topical nasal decongestants. They shrink swollen nasal passages and let mucus drain from  your sinuses.  Antihistamines. These drugs block inflammation that is triggered by allergies. This can help to ease swelling in your nose and sinuses.  Topical nasal corticosteroids. These are nasal sprays that ease inflammation and swelling in your nose and sinuses.  Nasal saline washes. These rinses can help to get rid of thick mucus in your nose.  If your condition is caused by bacteria, you will be given an antibiotic medicine. If your condition is caused by a fungus, you will be given an antifungal medicine. Surgery may be needed to correct underlying conditions, such as narrow nasal passages. Surgery may also be needed to remove polyps. Follow these instructions at home: Medicines  Take, use, or apply over-the-counter and prescription medicines only as told by your health care provider. These may include nasal sprays.  If you were prescribed an antibiotic medicine, take it as told by your health care provider. Do not stop taking the antibiotic even if you start to feel better. Hydrate and Humidify  Drink enough water to keep your urine clear or pale yellow. Staying hydrated will help to thin your mucus.  Use a cool mist humidifier to keep the humidity level in your home above 50%.  Inhale steam for 10-15 minutes, 3-4 times a day or as told by your health care provider. You can do this in the bathroom while a hot shower is running.  Limit your exposure to cool or dry air. Rest  Rest as much as possible.  Sleep with your head raised (elevated).  Make sure to get enough sleep each night. General instructions  Apply a warm, moist washcloth to your face 3-4 times a day or as told by your health care provider. This will help with discomfort.  Wash your hands often with soap and water to reduce your exposure to viruses and other germs. If soap and water are not available, use hand sanitizer.  Do not smoke. Avoid being around people who are smoking (secondhand smoke).  Keep all  follow-up visits as told by your health care provider. This is important. Contact a health care provider if:  You have a fever.  Your symptoms get worse.  Your symptoms do not improve within 10 days. Get help right away if:  You have a severe headache.  You have persistent vomiting.  You have pain or swelling around your face or eyes.  You have vision problems.  You develop confusion.  Your neck is stiff.  You have trouble breathing. This information is not intended to replace advice given to you by your health care provider. Make sure you discuss any questions you have with your health care provider. Document Released: 08/30/2005 Document Revised: 04/25/2016 Document Reviewed: 06/25/2015 Elsevier Interactive Patient Education  2017 [employer]

## 2017-07-28 NOTE — Progress Notes (Signed)
HPI:                                                                Junius RoadsJamie Bechtold is a 36 y.o. female [facility] today for URI symptoms  URI   This is a new problem. The current episode started in the past 7 days. The problem has been unchanged. There has been no fever. Associated symptoms include coughing, ear pain, a plugged ear sensation, a sore throat (hoarseness) and swollen glands. Pertinent negatives include no abdominal pain, chest pain, diarrhea, headaches, neck pain, rash or wheezing. She has tried antihistamine and decongestant for the symptoms.     Past Medical History:  Diagnosis Date  . Allergic rhinitis 12/11/2014   Dymista, singulair, claritin.    [name]. Hyperlipidemia 08/02/2016  . Lumbar degenerative disc disease 09/20/2013   Past Surgical History:  Procedure Laterality Date  . ARTHROSCOPIC REPAIR ACL  03/2015  . KNEE SURGERY Left    ACL   Social History   Tobacco Use  . Smoking status: Never Smoker  . Smokeless tobacco: Never Used  Substance Use Topics  . Alcohol use: Yes    Comment: 1-2 drinks a year   family history is not on file.  ROS: negative except as noted in the HPI  Medications: Current Outpatient Medications  Medication Sig Dispense Refill  . Azelastine HCl 0.15 % SOLN     . CAMRESE 0.15-0.03 &0.01 MG tablet     . loratadine (CLARITIN) 10 MG tablet Take 10 mg by mouth daily.    . montelukast (SINGULAIR) 10 MG tablet Take 10 mg by mouth at bedtime.    . sertraline (ZOLOFT) 100 MG tablet Take 1 tablet (100 mg total) by mouth daily. 90 tablet 1  . Azelastine-Fluticasone 137-50 MCG/ACT SUSP Place into the nose.     No current facility-administered medications for this visit.    No Known Allergies     Objective:  BP 121/82   Pulse 89   Temp 98 F (36.7 C)   Ht 5\' 9"  (1.753 m)   Wt 255 lb (115.7 kg)   SpO2 96%   BMI 37.66 kg/m  Gen:  alert, not ill-appearing, no  distress, appropriate for age, obese female HEENT: head normocephalic without obvious abnormality, conjunctiva and cornea clear, TM's clear bilaterally, oropharynx with erythema, no tonsillar exudates, there is tender tonsillar adenopathy, neck supple, trachea midline Pulm: Normal work of breathing, voice is hoarse, clear to auscultation bilaterally, no wheezes, rales or rhonchi CV: Normal rate, regular rhythm, s1 and s2 distinct, no murmurs, clicks or rubs  Neuro: alert and oriented x 3, no tremor MSK: extremities atraumatic, normal gait and station Skin: intact, no rashes on exposed skin, no jaundice, no cyanosis  No flowsheet data found.   No results found for this or any previous visit (from the past 72 hour(s)). No results found.    Assessment and Plan: 36 y.o. female with   1. Acute laryngopharyngitis - symptomatic management with oral analgesics, warm salt water gargles, nasal rinses and oral decongestant - push PO fluids   Patient education and anticipatory guidance given Patient agrees with treatment plan Follow-up as needed if symptoms worsen or fail to improve  Charley E.  Maisie Fus PA-C

## 2017-07-28 NOTE — Addendum Note (Signed)
Addended by: Donne AnonBENDER, Jervis Trapani L on: 07/28/2017 01:01 PM   Modules accepted: Orders

## 2017-07-29 ENCOUNTER — Ambulatory Visit: Payer: Managed Care, Other (non HMO) | Admitting: Family Medicine

## 2017-07-29 ENCOUNTER — Telehealth: Payer: Self-pay | Admitting: Family Medicine

## 2017-07-29 MED ORDER — AMOXICILLIN-POT CLAVULANATE 875-125 MG PO TABS: 1.0000 | ORAL_TABLET | Freq: Two times a day (BID) | ORAL | 0 refills | Status: DC

## 2017-07-29 NOTE — Telephone Encounter (Signed)
Husband called back and I spoke with Vinetta Bergamoharley and she ended up witting her an antibiotic. Thanks

## 2017-07-29 NOTE — Addendum Note (Signed)
Addended by: Gena FrayUMMINGS, Twila Rappa E on: 07/29/2017 10:27 AM   Modules accepted: Orders

## 2017-07-29 NOTE — Telephone Encounter (Signed)
Will, patient's husband called into the office for [place].  Asher MuirJamie was seen yesterday by Vinetta Bergamoharley.  She woke up this morning with increased ear pain, coughing up yellow mucus and overall feeling worse.  Wanted to know if she could get an antibiotic called into the pharmacy.

## 2017-08-02 ENCOUNTER — Other Ambulatory Visit: Payer: Self-pay | Admitting: *Deleted

## 2017-08-02 ENCOUNTER — Other Ambulatory Visit: Payer: Self-pay | Admitting: Family Medicine

## 2017-08-24 ENCOUNTER — Encounter: Payer: Self-pay | Admitting: Family Medicine

## 2017-08-24 ENCOUNTER — Ambulatory Visit (INDEPENDENT_AMBULATORY_CARE_PROVIDER_SITE_OTHER): Payer: Managed Care, Other (non HMO) | Admitting: Family Medicine

## 2017-08-24 VITALS — BP 134/82 | HR 103 | Temp 98.7°F | Ht 69.0 in | Wt 254.0 lb

## 2017-08-24 DIAGNOSIS — J0100 Acute maxillary sinusitis, unspecified: Secondary | ICD-10-CM | POA: Diagnosis not present

## 2017-08-24 MED ORDER — PREDNISONE 20 MG PO TABS: 40.0000 mg | ORAL_TABLET | Freq: Every day | ORAL | 0 refills | Status: DC

## 2017-08-24 MED ORDER — SERTRALINE HCL 100 MG PO TABS: 100.0000 mg | ORAL_TABLET | Freq: Every day | ORAL | 1 refills | Status: DC

## 2017-08-24 MED ORDER — LEVOFLOXACIN 500 MG PO TABS: 500.0000 mg | ORAL_TABLET | Freq: Every day | ORAL | 0 refills | Status: AC

## 2017-08-24 NOTE — Patient Instructions (Signed)
If not better then we can consider chronic treatment.

## 2017-08-24 NOTE — Progress Notes (Addendum)
   Subjective:    Patient ID: Kristina Harrington, female    DOB: February 19, 1981, 36 y.o.   MRN: [id number]  HPI 36 year old female comes in today complaining of upper respiratory symptoms.  She was actually previously seen in our office September 5 for sinusitis.  And then seen again November 15 for acute laryngopharyngitis.  She then went to see her ENT who placed her on Augmentin and prednisone.  She says she felt better for a week and then it came back about 5-6 days ago.  Getting yellow nasal discharge and green sputum.  Using Delsym at night.  Uses Affrin nasal spray. Alternating between IBU/Tylenol.  Right fontal HA and in the back of her head.  After she was seen on November 15 she called back the next day with worsening ear pain and worsening symptoms and was started on Augmentin. She has a lot of pain over her maxillary sinuses and right forehead today.    Review of Systems     Objective:   Physical Exam  Constitutional: She is oriented to person, place, and time. She appears well-developed and well-nourished.  HENT:  Head: Normocephalic and atraumatic.  Right Ear: External ear normal.  Left Ear: External ear normal.  Nose: Nose normal.  Mouth/Throat: Oropharynx is clear and moist.  TMs and canals are clear.   Eyes: Conjunctivae and EOM are normal. Pupils are equal, round, and reactive to light.  Neck: Neck supple. No thyromegaly present.  Cardiovascular: Normal rate, regular rhythm and normal heart sounds.  Pulmonary/Chest: Effort normal and breath sounds normal. She has no wheezes.  Lymphadenopathy:    She has no cervical adenopathy.  Neurological: She is alert and oriented to person, place, and time.  Skin: Skin is warm and dry.  Psychiatric: She has a normal mood and affect.         Assessment & Plan:  Acute sinusitis - will tx with Levaquin. Call if not better in one week. Continue symptomatic care but stop the decongestant.  Only use the Affrin once a day.  Also consider at  this point that she could also have chronic sinusitis.  It is difficult to tell if this is a recurrence of the original infection or if this is a new one.  We discussed options including switching to a different antibiotic versus treating for 21 days for chronic sinusitis.  We will start by just changing to Levaquin and if not better at that point then consider treating for chronic sinusitis with Augmentin.  This end of her prednisone for pain relief.

## 2017-08-25 ENCOUNTER — Telehealth: Payer: Self-pay

## 2017-08-25 MED ORDER — BENZONATATE 200 MG PO CAPS: 200.0000 mg | ORAL_CAPSULE | Freq: Two times a day (BID) | ORAL | 0 refills | Status: DC | PRN

## 2017-08-25 NOTE — Telephone Encounter (Signed)
Patient advised.

## 2017-08-25 NOTE — Telephone Encounter (Signed)
Rx sent 

## 2017-08-25 NOTE — Telephone Encounter (Signed)
Kristina MuirJamie states her cough is worse and she was up every 2 hours. She requested the Tessalon cough medication. Please advise.

## 2017-09-27 ENCOUNTER — Encounter: Payer: Self-pay | Admitting: Emergency Medicine

## 2017-09-27 ENCOUNTER — Emergency Department (INDEPENDENT_AMBULATORY_CARE_PROVIDER_SITE_OTHER)
Admission: EM | Admit: 2017-09-27 | Discharge: 2017-09-27 | Disposition: A | Discharge status: Home / Self Care | Payer: Managed Care, Other (non HMO) | Source: Home / Self Care | Attending: Family Medicine | Admitting: Family Medicine

## 2017-09-27 ENCOUNTER — Other Ambulatory Visit: Payer: Self-pay

## 2017-09-27 DIAGNOSIS — R112 Nausea with vomiting, unspecified: Secondary | ICD-10-CM

## 2017-09-27 DIAGNOSIS — R197 Diarrhea, unspecified: Secondary | ICD-10-CM | POA: Diagnosis not present

## 2017-09-27 LAB — POCT URINALYSIS DIP (MANUAL ENTRY)
Blood, UA: NEGATIVE
GLUCOSE UA: NEGATIVE mg/dL
Leukocytes, UA: NEGATIVE
NITRITE UA: NEGATIVE
UROBILINOGEN UA: 0.2 U/dL
pH, UA: 5.5 (ref 5.0–8.0)

## 2017-09-27 MED ORDER — ONDANSETRON 4 MG PO TBDP: 4.0000 mg | ORAL_TABLET | Freq: Three times a day (TID) | ORAL | 0 refills | Status: DC | PRN

## 2017-09-27 MED ORDER — DICYCLOMINE HCL 20 MG PO TABS: 20.0000 mg | ORAL_TABLET | Freq: Two times a day (BID) | ORAL | 0 refills | Status: DC

## 2017-09-27 MED ORDER — ONDANSETRON 4 MG PO TBDP
4.0000 mg | ORAL_TABLET | Freq: Once | ORAL | Status: AC
  Administered 2017-09-27: 4 mg via ORAL

## 2017-09-27 NOTE — ED Triage Notes (Signed)
Diarrhea and vomiting started this am.

## 2017-09-27 NOTE — ED Provider Notes (Signed)
[facility]    CSN: [id number] Arrival date & time: 09/27/17  1554     History   Chief Complaint Chief Complaint  Patient presents with  . Diarrhea    HPI Kristina Harrington is a 37 y.o. female.   The history is provided by the patient. No language interpreter was used.  Diarrhea  Quality:  Unable to specify Severity:  Moderate Onset quality:  Gradual Number of episodes:  Multiple Duration:  1 day Timing:  Constant Progression:  Worsening Relieved by:  Nothing Worsened by:  Nothing Associated symptoms: no abdominal pain   Risk factors: no sick contacts   Pt complains of vomiting and diarrhea.  Pt reports lower abdominal cramping.   Past Medical History:  Diagnosis Date  . Allergic rhinitis 12/11/2014   Dymista, singulair, claritin.    [name] Hyperlipidemia 08/02/2016  . Lumbar degenerative disc disease 09/20/2013    Patient Active Problem List   Diagnosis Date Noted  . Acute stress reaction 08/02/2016  . Hyperlipidemia 08/02/2016  . Allergic rhinitis 12/11/2014  . Iliotibial band syndrome of right side 11/02/2013  . Lumbar degenerative disc disease 09/20/2013    Past Surgical History:  Procedure Laterality Date  . ARTHROSCOPIC REPAIR ACL  03/2015  . KNEE SURGERY Left    ACL    OB History    No data available       Home Medications    Prior to Admission medications   Medication Sig Start Date End Date Taking? Authorizing Provider  Azelastine HCl 0.15 % SOLN  06/11/16   [provider]  Azelastine-Fluticasone 137-50 MCG/ACT SUSP Place into the nose.    [provider]  benzonatate (TESSALON) 200 MG capsule Take 1 capsule (200 mg total) by mouth 2 (two) times daily as needed for cough. 08/25/17   Agapito Games, MD  CAMRESE 0.15-0.03 &0.01 MG tablet  05/24/16   [provider]  dicyclomine (BENTYL) 20 MG tablet Take 1 tablet (20 mg total) by mouth 2 (two) times daily. 09/27/17   Elson Areas, PA-C  loratadine  (CLARITIN) 10 MG tablet Take 10 mg by mouth daily.    [provider]  montelukast (SINGULAIR) 10 MG tablet Take 10 mg by mouth at bedtime.    [provider]  ondansetron (ZOFRAN ODT) 4 MG disintegrating tablet Take 1 tablet (4 mg total) by mouth every 8 (eight) hours as needed for nausea or vomiting. 09/27/17   Elson Areas, PA-C  predniSONE (DELTASONE) 20 MG tablet Take 2 tablets (40 mg total) by mouth daily. 08/24/17   Agapito Games, MD  sertraline (ZOLOFT) 100 MG tablet Take 1 tablet (100 mg total) by mouth daily. 08/24/17   Agapito Games, MD    Family History No family history on file.  Social History Social History   Tobacco Use  . Smoking status: Never Smoker  . Smokeless tobacco: Never Used  Substance Use Topics  . Alcohol use: Yes    Comment: 1-2 drinks a year  . Drug use: No     Allergies   Patient has no known allergies.   Review of Systems Review of Systems  Gastrointestinal: Positive for diarrhea. Negative for abdominal pain.  All other systems reviewed and are negative.    Physical Exam Triage Vital Signs ED Triage Vitals  Enc Vitals Group     BP 09/27/17 1612 114/77     Pulse Rate 09/27/17 1612 (!) 113     Resp --  Temp 09/27/17 1612 (!) 97.3 F (36.3 C)     Temp Source 09/27/17 1612 Oral     SpO2 09/27/17 1612 98 %     Weight 09/27/17 1612 249 lb (112.9 kg)     Height 09/27/17 1612 5\' 9"  (1.753 m)     Head Circumference --      Peak Flow --      Pain Score 09/27/17 1613 10     Pain Loc --      Pain Edu? --      Excl. in GC? --    No data found.  Updated Vital Signs BP 114/77 (BP Location: Right Arm)   Pulse (!) 106   Temp (!) 97.3 F (36.3 C) (Oral)   Ht 5\' 9"  (1.753 m)   Wt 249 lb (112.9 kg)   SpO2 98%   BMI 36.77 kg/m   Visual Acuity Right Eye Distance:   Left Eye Distance:   Bilateral Distance:    Right Eye Near:   Left Eye Near:    Bilateral Near:     Physical Exam    Constitutional: She appears well-developed and well-nourished. No distress.  HENT:  Head: Normocephalic and atraumatic.  Right Ear: External ear normal.  Left Ear: External ear normal.  Nose: Nose normal.  Mouth/Throat: Oropharynx is clear and moist.  Eyes: Conjunctivae are normal.  Neck: Neck supple.  Cardiovascular: Normal rate and regular rhythm.  No murmur heard. Pulmonary/Chest: Effort normal and breath sounds normal. No respiratory distress.  Abdominal: Soft. There is no tenderness.  Musculoskeletal: She exhibits no edema.  Neurological: She is alert.  Skin: Skin is warm and dry.  Psychiatric: She has a normal mood and affect.  Nursing note and vitals reviewed.    UC Treatments / Results  Labs (all labs ordered are listed, but only abnormal results are displayed) Labs Reviewed  POCT URINALYSIS DIP (MANUAL ENTRY) - Abnormal; Notable for the following components:      Result Value   Color, UA other (*)    Bilirubin, UA large (*)    Ketones, POC UA moderate (40) (*)    Spec Grav, UA >=1.030 (*)    Protein Ur, POC =100 (*)    All other components within normal limits    EKG  EKG Interpretation None       Radiology No results found.  Procedures Procedures (including critical care time)  Medications Ordered in UC Medications  ondansetron (ZOFRAN-ODT) disintegrating tablet 4 mg (4 mg Oral Given 09/27/17 1622)     Initial Impression / Assessment and Plan / UC Course  I have reviewed the triage vital signs and the nursing notes.  Pertinent labs & imaging results that were available during my care of the patient were reviewed by me and considered in my medical decision making (see chart for details).     Meds ordered this encounter  Medications  . ondansetron (ZOFRAN-ODT) disintegrating tablet 4 mg  . ondansetron (ZOFRAN ODT) 4 MG disintegrating tablet    Sig: Take 1 tablet (4 mg total) by mouth every 8 (eight) hours as needed for nausea or vomiting.     Dispense:  10 tablet    Refill:  0    Order Specific Question:   Supervising Provider    Answer:   Georgina Pillion, DAVID [[id number]]  . dicyclomine (BENTYL) 20 MG tablet    Sig: Take 1 tablet (20 mg total) by mouth 2 (two) times daily.    Dispense:  10 tablet  Refill:  0    Order Specific Question:   Supervising Provider    Answer:   Georgina PillionMASSEY, DAVID [[id number]]    An After Visit Summary was printed and given to the patient.  Final Clinical Impressions(s) / UC Diagnoses   Final diagnoses:  Nausea vomiting and diarrhea    ED Discharge Orders        Ordered    ondansetron (ZOFRAN ODT) 4 MG disintegrating tablet  Every 8 hours PRN     09/27/17 1712    dicyclomine (BENTYL) 20 MG tablet  2 times daily     09/27/17 1712     Pt feels better. Pt able to drink gingerale and water without vomiting. Pt counseled on viral illness   Controlled Substance Prescriptions [place] Controlled Substance Registry consulted? Not Applicable   Elson AreasSofia, Stonewall Doss K, [place]-C 09/27/17 [id number]

## 2017-09-27 NOTE — Discharge Instructions (Signed)
Drink plenty of fluids.  Return if any problems. See your Physician for recheck if symptoms last more than 24 hours from now.  Go to the Emergency department if medications do not manage vomiting and cramping

## 2017-09-27 NOTE — ED Notes (Signed)
Pt has completed 2 cups of ice chips and a few sips of ginger ale. She reports she is feeling better than when she came in.

## 2017-09-27 NOTE — ED Notes (Signed)
Given second cup of ice chips. Clemens Catholichristy Derrika Ruffalo, LPN

## 2017-09-27 NOTE — ED Notes (Signed)
Given ice chips and ginger ale.

## 2018-05-06 ENCOUNTER — Encounter: Payer: Self-pay | Admitting: Emergency Medicine

## 2018-05-06 ENCOUNTER — Emergency Department (INDEPENDENT_AMBULATORY_CARE_PROVIDER_SITE_OTHER)
Admission: EM | Admit: 2018-05-06 | Discharge: 2018-05-06 | Disposition: A | Discharge status: Home / Self Care | Payer: Managed Care, Other (non HMO) | Source: Home / Self Care | Attending: Family Medicine | Admitting: Family Medicine

## 2018-05-06 DIAGNOSIS — H6691 Otitis media, unspecified, right ear: Secondary | ICD-10-CM | POA: Diagnosis not present

## 2018-05-06 DIAGNOSIS — J040 Acute laryngitis: Secondary | ICD-10-CM

## 2018-05-06 MED ORDER — AMOXICILLIN-POT CLAVULANATE 875-125 MG PO TABS: 1.0000 | ORAL_TABLET | Freq: Two times a day (BID) | ORAL | 0 refills | Status: DC

## 2018-05-06 NOTE — ED Provider Notes (Signed)
Kristina Har[facility]    CSN: [id number] Arrival date & time: 05/06/18  0910     History   Chief Complaint Chief Complaint  Patient presents with  . Sore Throat    HPI Kristina Harrington is a 37 y.o. female.   HPI Kristina Harrington is a 37 y.o. female presenting to UC with c/o 5 days of nasal congestion and 2 days of sore throat, Right itchy and sore pain, nasal discharge and pressure. She has tried Dynamist and Neti pot with mild relief. Hx of sinus infections, last one was at the beginning of the year.  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  Denies fever, chills, n/v/d.    Past Medical History:  Diagnosis Date  . Allergic rhinitis 12/11/2014   Dymista, singulair, claritin.    [name] Hyperlipidemia 08/02/2016  . Lumbar degenerative disc disease 09/20/2013    Patient Active Problem List   Diagnosis Date Noted  . Acute stress reaction 08/02/2016  . Hyperlipidemia 08/02/2016  . Allergic rhinitis 12/11/2014  . Iliotibial band syndrome of right side 11/02/2013  . Lumbar degenerative disc disease 09/20/2013    Past Surgical History:  Procedure Laterality Date  . ARTHROSCOPIC REPAIR ACL  03/2015  . KNEE SURGERY Left    ACL    OB History   None      Home Medications    Prior to Admission medications   Medication Sig Start Date End Date Taking? Authorizing Provider  Azelastine HCl 0.15 % SOLN  06/11/16  Yes [provider]  Azelastine-Fluticasone 137-50 MCG/ACT SUSP Place into the nose.   Yes [provider]  benzonatate (TESSALON) 200 MG capsule Take 1 capsule (200 mg total) by mouth 2 (two) times daily as needed for cough. 08/25/17  Yes Agapito Games, MD  CAMRESE 0.15-0.03 &0.01 MG tablet  05/24/16  Yes [provider]  dicyclomine (BENTYL) 20 MG tablet Take 1 tablet (20 mg total) by mouth 2 (two) times daily. 09/27/17  Yes Cheron Schaumann K, PA-C  loratadine  (CLARITIN) 10 MG tablet Take 10 mg by mouth daily.   Yes [provider]  montelukast (SINGULAIR) 10 MG tablet Take 10 mg by mouth at bedtime.   Yes [provider]  ondansetron (ZOFRAN ODT) 4 MG disintegrating tablet Take 1 tablet (4 mg total) by mouth every 8 (eight) hours as needed for nausea or vomiting. 09/27/17  Yes Cheron Schaumann K, PA-C  predniSONE (DELTASONE) 20 MG tablet Take 2 tablets (40 mg total) by mouth daily. 08/24/17  Yes Agapito Games, MD  sertraline (ZOLOFT) 100 MG tablet Take 1 tablet (100 mg total) by mouth daily. 08/24/17  Yes Agapito Games, MD  amoxicillin-clavulanate (AUGMENTIN) 875-125 MG tablet Take 1 tablet by mouth 2 (two) times daily. One po bid x 7 days 05/06/18   Lurene Shadow, PA-C    Family History No family history on file.  Social History Social History   Tobacco Use  . Smoking status: Never Smoker  . Smokeless tobacco: Never Used  Substance Use Topics  . Alcohol use: Yes    Comment: 1-2 drinks a year  . Drug use: No     Allergies   Patient has no known allergies.   Review of Systems Review of Systems  Constitutional: Negative for chills and fever.  HENT: Positive for congestion, ear pain, postnasal drip, sinus pressure, sinus pain and sore throat.  Negative for trouble swallowing and voice change.   Respiratory: Negative for cough and shortness of breath.   Cardiovascular: Negative for chest pain and palpitations.  Gastrointestinal: Negative for abdominal pain, diarrhea, nausea and vomiting.  Musculoskeletal: Negative for arthralgias, back pain and myalgias.  Skin: Negative for rash.  Neurological: Positive for headaches ( frontal). Negative for dizziness and light-headedness.     Physical Exam Triage Vital Signs ED Triage Vitals [05/06/18 0929]  Enc Vitals Group     BP 114/77     Pulse Rate 76     Resp      Temp 98.8 F (37.1 C)     Temp Source Oral     SpO2 97 %     Weight 248 lb (112.5 kg)      Height 5\' 9"  (1.753 m)     Head Circumference      Peak Flow      Pain Score 8     Pain Loc      Pain Edu?      Excl. in GC?    No data found.  Updated Vital Signs BP 114/77 (BP Location: Right Arm)   Pulse 76   Temp 98.8 F (37.1 C) (Oral)   Ht 5\' 9"  (1.753 m)   Wt 248 lb (112.5 kg)   LMP 02/24/2018 (Exact Date)   SpO2 97%   BMI 36.62 kg/m   Visual Acuity Right Eye Distance:   Left Eye Distance:   Bilateral Distance:    Right Eye Near:   Left Eye Near:    Bilateral Near:     Physical Exam  Constitutional: She is oriented to person, place, and time. She appears well-developed and well-nourished.  Non-toxic appearance. She does not appear ill. No distress.  HENT:  Head: Normocephalic and atraumatic.  Right Ear: Tympanic membrane is erythematous. Tympanic membrane is not bulging. A middle ear effusion is present.  Left Ear: Tympanic membrane normal.  Nose: Nose normal. Right sinus exhibits no maxillary sinus tenderness and no frontal sinus tenderness. Left sinus exhibits no maxillary sinus tenderness and no frontal sinus tenderness.  Mouth/Throat: Uvula is midline, oropharynx is clear and moist and mucous membranes are normal.  Eyes: EOM are normal.  Neck: Normal range of motion. Neck supple.  Cardiovascular: Normal rate and regular rhythm.  Pulmonary/Chest: Effort normal and breath sounds normal. No stridor. She has no wheezes. She has no rhonchi.  Hoarse voice no stridor.  Musculoskeletal: Normal range of motion.  Lymphadenopathy:    She has no cervical adenopathy.  Neurological: She is alert and oriented to person, place, and time.  Skin: Skin is warm and dry.  Psychiatric: She has a normal mood and affect. Her behavior is normal.  Nursing note and vitals reviewed.    UC Treatments / Results  Labs (all labs ordered are listed, but only abnormal results are displayed) Labs Reviewed - No data to display  EKG None  Radiology No results  found.  Procedures Procedures (including critical care time)  Medications Ordered in UC Medications - No data to display  Initial Impression / Assessment and Plan / UC Course  I have reviewed the triage vital signs and the nursing notes.  Pertinent labs & imaging results that were available during my care of the patient were reviewed by me and considered in my medical decision making (see chart for details).     Hx and exam c/w Right AOM Will tx with augmentin  Home instructions provided  Final  Clinical Impressions(s) / UC Diagnoses   Final diagnoses:  Right acute otitis media  Laryngitis     Discharge Instructions      You may continue to use the Neti pot sinus rinses and nasal spray along with Tylenol and/or Motrin for fever or pain.   Please take antibiotics as prescribed and be sure to complete entire course even if you start to feel better to ensure infection does not come back.  Please follow up with family medicine in 7-10 days if not improving.    ED Prescriptions    Medication Sig Dispense Auth. Provider   amoxicillin-clavulanate (AUGMENTIN) 875-125 MG tablet Take 1 tablet by mouth 2 (two) times daily. One po bid x 7 days 14 tablet Lurene Shadow, [place]     Controlled Substance Prescriptions [place] Controlled Substance Registry consulted? Not Applicable   Rolla Plate 05/06/18 1118

## 2018-05-06 NOTE — ED Triage Notes (Signed)
Patient c/o sore throat x 2 days, itchy right ear, nasal drainage, no cough more irritated.

## 2018-05-06 NOTE — Discharge Instructions (Signed)
°  You may continue to use the Neti pot sinus rinses and nasal spray along with Tylenol and/or Motrin for fever or pain.   Please take antibiotics as prescribed and be sure to complete entire course even if you start to feel better to ensure infection does not come back.  Please follow up with family medicine in 7-10 days if not improving.

## 2018-07-07 ENCOUNTER — Encounter: Payer: Self-pay | Admitting: Emergency Medicine

## 2018-07-07 ENCOUNTER — Emergency Department
Admission: EM | Admit: 2018-07-07 | Discharge: 2018-07-07 | Disposition: A | Discharge status: Home / Self Care | Payer: Managed Care, Other (non HMO) | Source: Home / Self Care | Attending: Family Medicine | Admitting: Family Medicine

## 2018-07-07 DIAGNOSIS — R197 Diarrhea, unspecified: Secondary | ICD-10-CM | POA: Diagnosis not present

## 2018-07-07 NOTE — ED Triage Notes (Signed)
Pt c/o diarrhea since Monday after a neighborhood potluck. General stomach pain. Afebrile. No meds taken,.

## 2018-07-07 NOTE — Discharge Instructions (Addendum)
Begin clear liquids (Pedialyte while having diarrhea) until improved, then advance to a BRAT diet (Bananas, Rice, Applesauce, Toast).  Then gradually resume a regular diet when tolerated.  Avoid milk products until well.  When stools become more formed, may take Imodium (loperamide) once or twice daily to decrease stool frequency.  °If symptoms become significantly worse during the night or over the weekend, proceed to the local emergency room.  °

## 2018-07-07 NOTE — ED Provider Notes (Signed)
[facility]    CSN: [id number] Arrival date & time: 07/07/18  1143     History   Chief Complaint Chief Complaint  Patient presents with  . Diarrhea    HPI Kristina Harrington is a 37 y.o. female.   After eating chili at a potluck four days ago patient developed initial abdominal discomfort followed by lower abdominal cramps and persistent diarrhea.  She has been having 3 to 5 episodes of diarrhea per day.  No nausea/vomiting.  No fevers, chills, and sweats.  She has continued to eat normally. She states that two friends developed similar symptoms that resolved in two days.  The history is provided by the patient.  Diarrhea  Quality:  Watery Severity:  Moderate Onset quality:  Sudden Number of episodes:  3 to 5 per day Duration:  4 days Timing:  Intermittent Progression:  Unchanged Relieved by:  Nothing Exacerbated by: eating. Ineffective treatments:  None tried Associated symptoms: abdominal pain   Associated symptoms: no chills, no diaphoresis, no fever, no myalgias and no vomiting   Risk factors: suspect food intake   Risk factors: no recent antibiotic use, no sick contacts and no travel to endemic areas     Past Medical History:  Diagnosis Date  . Allergic rhinitis 12/11/2014   Dymista, singulair, claritin.    [name] Hyperlipidemia 08/02/2016  . Lumbar degenerative disc disease 09/20/2013    Patient Active Problem List   Diagnosis Date Noted  . Acute stress reaction 08/02/2016  . Hyperlipidemia 08/02/2016  . Allergic rhinitis 12/11/2014  . Iliotibial band syndrome of right side 11/02/2013  . Lumbar degenerative disc disease 09/20/2013    Past Surgical History:  Procedure Laterality Date  . ARTHROSCOPIC REPAIR ACL  03/2015  . KNEE SURGERY Left    ACL    OB History   None      Home Medications    Prior to Admission medications   Medication Sig Start Date End Date Taking? Authorizing Provider  Azelastine HCl 0.15 % SOLN  06/11/16   [provider]  CAMRESE 0.15-0.03 &0.01 MG tablet  05/24/16   [provider]  dicyclomine (BENTYL) 20 MG tablet Take 1 tablet (20 mg total) by mouth 2 (two) times daily. 09/27/17   Elson Areas, PA-C  loratadine (CLARITIN) 10 MG tablet Take 10 mg by mouth daily.    [provider]  montelukast (SINGULAIR) 10 MG tablet Take 10 mg by mouth at bedtime.    [provider]  sertraline (ZOLOFT) 100 MG tablet Take 1 tablet (100 mg total) by mouth daily. 08/24/17   Agapito Games, MD    Family History History reviewed. No pertinent family history.  Social History Social History   Tobacco Use  . Smoking status: Never Smoker  . Smokeless tobacco: Never Used  Substance Use Topics  . Alcohol use: Yes    Comment: 1-2 drinks a year  . Drug use: No     Allergies   Patient has no known allergies.   Review of Systems Review of Systems  Constitutional: Negative for chills, diaphoresis and fever.  Gastrointestinal: Positive for abdominal pain and diarrhea. Negative for vomiting.  Musculoskeletal: Negative for myalgias.  All other systems reviewed and are negative.    Physical Exam Triage Vital Signs ED Triage Vitals  Enc Vitals Group     BP 07/07/18 1232 126/80     Pulse Rate 07/07/18 1232 78     Resp --  Temp 07/07/18 1232 98.4 F (36.9 C)     Temp Source 07/07/18 1232 Oral     SpO2 07/07/18 1232 97 %     Weight 07/07/18 1233 244 lb (110.7 kg)     Height --      Head Circumference --      Peak Flow --      Pain Score 07/07/18 1233 0     Pain Loc --      Pain Edu? --      Excl. in GC? --    No data found.  Updated Vital Signs BP 126/80 (BP Location: Right Arm)   Pulse 78   Temp 98.4 F (36.9 C) (Oral)   Wt 110.7 kg   LMP 06/16/2018 (Approximate)   SpO2 97%   BMI 36.03 kg/m   Visual Acuity Right Eye Distance:   Left Eye Distance:   Bilateral Distance:    Right Eye Near:   Left Eye Near:    Bilateral Near:     Physical  Exam Nursing notes and Vital Signs reviewed. Appearance:  Patient appears stated age, and in no acute distress.    Eyes:  Pupils are equal, round, and reactive to light and accomodation.  Extraocular movement is intact.  Conjunctivae are not inflamed   Pharynx:  Normal; moist mucous membranes  Neck:  Supple.  No adenopathy Lungs:  Clear to auscultation.  Breath sounds are equal.  Moving air well. Heart:  Regular rate and rhythm without murmurs, rubs, or gallops.  Abdomen:  Nontender without masses or hepatosplenomegaly.  Increased bowel sounds are present.  No CVA or flank tenderness.  Extremities:  No edema.  Skin:  No rash present.     UC Treatments / Results  Labs (all labs ordered are listed, but only abnormal results are displayed) Labs Reviewed - No data to display  EKG None  Radiology No results found.  Procedures Procedures (including critical care time)  Medications Ordered in UC Medications - No data to display  Initial Impression / Assessment and Plan / UC Course  I have reviewed the triage vital signs and the nursing notes.  Pertinent labs & imaging results that were available during my care of the patient were reviewed by me and considered in my medical decision making (see chart for details).    Suspect viral gastroenteritis. Followup with Family Doctor if not improved in about 4 days.   Final Clinical Impressions(s) / UC Diagnoses   Final diagnoses:  Diarrhea, unspecified type     Discharge Instructions     Begin clear liquids (Pedialyte while having diarrhea) until improved, then advance to a [employer] (Bananas, Rice, Applesauce, Toast).  Then gradually resume a regular diet when tolerated.  Avoid milk products until well.    When stools become more formed, may take Imodium (loperamide) once or twice daily to decrease stool frequency.  If symptoms become significantly worse during the night or over the weekend, proceed to the local emergency room.      ED Prescriptions    None        Lattie Haw, MD 07/09/18 [phone]

## 2018-08-09 ENCOUNTER — Telehealth: Payer: Self-pay | Admitting: Family Medicine

## 2018-08-09 DIAGNOSIS — Z0000 Encounter for general adult medical examination without abnormal findings: Secondary | ICD-10-CM

## 2018-08-09 NOTE — Telephone Encounter (Signed)
Patient would like to have labs done as soon as possible prior to her physical on 09/21/2018. Would like a full thyroid panel done as well. Please contact and advise once completed.

## 2018-08-09 NOTE — Telephone Encounter (Signed)
lvm advising pt labs have been faxed.Kristina Harrington.Andriea Hasegawa, [name], CMA

## 2018-08-10 ENCOUNTER — Other Ambulatory Visit: Payer: Self-pay | Admitting: Family Medicine

## 2018-08-15 ENCOUNTER — Other Ambulatory Visit: Payer: Self-pay | Admitting: *Deleted

## 2018-08-15 DIAGNOSIS — E785 Hyperlipidemia, unspecified: Secondary | ICD-10-CM

## 2018-08-15 DIAGNOSIS — R748 Abnormal levels of other serum enzymes: Secondary | ICD-10-CM

## 2018-08-15 MED ORDER — ATORVASTATIN CALCIUM 10 MG PO TABS: 10.0000 mg | ORAL_TABLET | Freq: Every day | ORAL | 3 refills | Status: DC

## 2018-08-17 LAB — ACUTE HEP PANEL AND HEP B SURFACE AB
HEP A IGM: NONREACTIVE
HEP B C IGM: NONREACTIVE
HEP B S AG: NONREACTIVE
HEPATITIS C ANTIBODY REFILL: NONREACTIVE
SIGNAL TO CUT-OFF: 0.02 (ref <1.00)

## 2018-08-17 LAB — CBC WITH DIFFERENTIAL/PLATELET
BASOS PCT: 0.6 %
Basophils Absolute: 43 cells/uL (ref 0–200)
EOS PCT: 1 %
Eosinophils Absolute: 71 cells/uL (ref 15–500)
HCT: 41.4 % (ref 35.0–45.0)
Hemoglobin: 14.4 g/dL (ref 11.7–15.5)
Lymphs Abs: 2400 cells/uL (ref 850–3900)
MCH: 33.1 pg — ABNORMAL HIGH (ref 27.0–33.0)
MCHC: 34.8 g/dL (ref 32.0–36.0)
MCV: 95.2 fL (ref 80.0–100.0)
MONOS PCT: 5.9 %
MPV: 11.7 fL (ref 7.5–12.5)
NEUTROS PCT: 58.7 %
Neutro Abs: 4168 cells/uL (ref 1500–7800)
PLATELETS: 273 10*3/uL (ref 140–400)
RBC: 4.35 10*6/uL (ref 3.80–5.10)
RDW: 11.5 % (ref 11.0–15.0)
TOTAL LYMPHOCYTE: 33.8 %
WBC: 7.1 10*3/uL (ref 3.8–10.8)
WBCMIX: 419 {cells}/uL (ref 200–950)

## 2018-08-17 LAB — TSH: TSH: 1.58 m[IU]/L

## 2018-08-17 LAB — LIPID PANEL
CHOL/HDL RATIO: 5.8 (calc) — AB (ref <5.0)
Cholesterol: 285 mg/dL — ABNORMAL HIGH (ref <200)
HDL: 49 mg/dL — ABNORMAL LOW (ref >50)
LDL CHOLESTEROL (CALC): 193 mg/dL — AB
NON-HDL CHOLESTEROL (CALC): 236 mg/dL — AB (ref <130)
TRIGLYCERIDES: 234 mg/dL — AB (ref <150)

## 2018-08-17 LAB — COMPLETE METABOLIC PANEL WITH GFR
AG RATIO: 1.7 (calc) (ref 1.0–2.5)
ALT: 121 U/L — ABNORMAL HIGH (ref 6–29)
AST: 94 U/L — ABNORMAL HIGH (ref 10–30)
Albumin: 4.7 g/dL (ref 3.6–5.1)
Alkaline phosphatase (APISO): 136 U/L — ABNORMAL HIGH (ref 33–115)
BUN: 11 mg/dL (ref 7–25)
CALCIUM: 9.9 mg/dL (ref 8.6–10.2)
CO2: 28 mmol/L (ref 20–32)
CREATININE: 0.8 mg/dL (ref 0.50–1.10)
Chloride: 103 mmol/L (ref 98–110)
GFR, EST AFRICAN AMERICAN: 111 mL/min/{1.73_m2} (ref >=60)
GFR, EST NON AFRICAN AMERICAN: 96 mL/min/{1.73_m2} (ref >=60)
Globulin: 2.7 g/dL (calc) (ref 1.9–3.7)
Glucose, Bld: 86 mg/dL (ref 65–99)
Potassium: 4.2 mmol/L (ref 3.5–5.3)
Sodium: 140 mmol/L (ref 135–146)
TOTAL PROTEIN: 7.4 g/dL (ref 6.1–8.1)
Total Bilirubin: 0.6 mg/dL (ref 0.2–1.2)

## 2018-08-17 LAB — REFLEX TIQ

## 2018-08-21 ENCOUNTER — Encounter (HOSPITAL_COMMUNITY): Payer: Self-pay

## 2018-08-21 ENCOUNTER — Ambulatory Visit (HOSPITAL_COMMUNITY): Payer: Managed Care, Other (non HMO)

## 2018-08-22 ENCOUNTER — Ambulatory Visit (HOSPITAL_COMMUNITY)
Admission: RE | Admit: 2018-08-22 | Discharge: 2018-08-22 | Disposition: A | Discharge status: Home / Self Care | Payer: Managed Care, Other (non HMO) | Source: Ambulatory Visit | Attending: Family Medicine | Admitting: Family Medicine

## 2018-08-22 DIAGNOSIS — R748 Abnormal levels of other serum enzymes: Secondary | ICD-10-CM

## 2018-08-22 DIAGNOSIS — K760 Fatty (change of) liver, not elsewhere classified: Secondary | ICD-10-CM | POA: Insufficient documentation

## 2018-09-21 ENCOUNTER — Encounter: Payer: Managed Care, Other (non HMO) | Admitting: Family Medicine

## 2018-10-16 ENCOUNTER — Other Ambulatory Visit: Payer: Self-pay | Admitting: *Deleted

## 2018-10-16 ENCOUNTER — Ambulatory Visit (INDEPENDENT_AMBULATORY_CARE_PROVIDER_SITE_OTHER): Payer: Managed Care, Other (non HMO) | Admitting: Family Medicine

## 2018-10-16 ENCOUNTER — Encounter: Payer: Self-pay | Admitting: Family Medicine

## 2018-10-16 VITALS — BP 122/59 | HR 67 | Ht 69.0 in | Wt 244.0 lb

## 2018-10-16 DIAGNOSIS — Z111 Encounter for screening for respiratory tuberculosis: Secondary | ICD-10-CM | POA: Diagnosis not present

## 2018-10-16 DIAGNOSIS — R748 Abnormal levels of other serum enzymes: Secondary | ICD-10-CM

## 2018-10-16 NOTE — Patient Instructions (Addendum)
Call back if you feel your sinuses are getting worse and you think might be turning into a sinus infection.   Health Maintenance, Female Adopting a healthy lifestyle and getting preventive care can go a long way to promote health and wellness. Talk with your health care provider about what schedule of regular examinations is right for you. This is a good chance for you to check in with your provider about disease prevention and staying healthy. In between checkups, there are plenty of things you can do on your own. Experts have done a lot of research about which lifestyle changes and preventive measures are most likely to keep you healthy. Ask your health care provider for more information. Weight and diet Eat a healthy diet  Be sure to include plenty of vegetables, fruits, low-fat dairy products, and lean protein.  Do not eat a lot of foods high in solid fats, added sugars, or salt.  Get regular exercise. This is one of the most important things you can do for your health. ? Most adults should exercise for at least 150 minutes each week. The exercise should increase your heart rate and make you sweat (moderate-intensity exercise). ? Most adults should also do strengthening exercises at least twice a week. This is in addition to the moderate-intensity exercise. Maintain a healthy weight  Body mass index (BMI) is a measurement that can be used to identify possible weight problems. It estimates body fat based on height and weight. Your health care provider can help determine your BMI and help you achieve or maintain a healthy weight.  For females 41 years of age and older: ? A BMI below 18.5 is considered underweight. ? A BMI of 18.5 to 24.9 is normal. ? A BMI of 25 to 29.9 is considered overweight. ? A BMI of 30 and above is considered obese. Watch levels of cholesterol and blood lipids  You should start having your blood tested for lipids and cholesterol at 36 years of age, then have this  test every 5 years.  You may need to have your cholesterol levels checked more often if: ? Your lipid or cholesterol levels are high. ? You are older than 36 years of age. ? You are at high risk for heart disease. Cancer screening Lung Cancer  Lung cancer screening is recommended for adults 51-57 years old who are at high risk for lung cancer because of a history of smoking.  A yearly low-dose CT scan of the lungs is recommended for people who: ? Currently smoke. ? Have quit within the past 15 years. ? Have at least a 30-pack-year history of smoking. A pack year is smoking an average of one pack of cigarettes a day for 1 year.  Yearly screening should continue until it has been 15 years since you quit.  Yearly screening should stop if you develop a health problem that would prevent you from having lung cancer treatment. Breast Cancer  Practice breast self-awareness. This means understanding how your breasts normally appear and feel.  It also means doing regular breast self-exams. Let your health care provider know about any changes, no matter how small.  If you are in your 20s or 30s, you should have a clinical breast exam (CBE) by a health care provider every 1-3 years as part of a regular health exam.  If you are 17 or older, have a CBE every year. Also consider having a breast X-ray (mammogram) every year.  If you have a family history of  breast cancer, talk to your health care provider about genetic screening.  If you are at high risk for breast cancer, talk to your health care provider about having an MRI and a mammogram every year.  Breast cancer gene (BRCA) assessment is recommended for women who have family members with BRCA-related cancers. BRCA-related cancers include: ? Breast. ? Ovarian. ? Tubal. ? Peritoneal cancers.  Results of the assessment will determine the need for genetic counseling and BRCA1 and BRCA2 testing. Cervical Cancer Your health care provider may  recommend that you be screened regularly for cancer of the pelvic organs (ovaries, uterus, and vagina). This screening involves a pelvic examination, including checking for microscopic changes to the surface of your cervix (Pap test). You may be encouraged to have this screening done every 3 years, beginning at age 60.  For women ages 47-65, health care providers may recommend pelvic exams and Pap testing every 3 years, or they may recommend the Pap and pelvic exam, combined with testing for human papilloma virus (HPV), every 5 years. Some types of HPV increase your risk of cervical cancer. Testing for HPV may also be done on women of any age with unclear Pap test results.  Other health care providers may not recommend any screening for nonpregnant women who are considered low risk for pelvic cancer and who do not have symptoms. Ask your health care provider if a screening pelvic exam is right for you.  If you have had past treatment for cervical cancer or a condition that could lead to cancer, you need Pap tests and screening for cancer for at least 20 years after your treatment. If Pap tests have been discontinued, your risk factors (such as having a new sexual partner) need to be reassessed to determine if screening should resume. Some women have medical problems that increase the chance of getting cervical cancer. In these cases, your health care provider may recommend more frequent screening and Pap tests. Colorectal Cancer  This type of cancer can be detected and often prevented.  Routine colorectal cancer screening usually begins at 36 years of age and continues through 36 years of age.  Your health care provider may recommend screening at an earlier age if you have risk factors for colon cancer.  Your health care provider may also recommend using home test kits to check for hidden blood in the stool.  A small camera at the end of a tube can be used to examine your colon directly  (sigmoidoscopy or colonoscopy). This is done to check for the earliest forms of colorectal cancer.  Routine screening usually begins at age 45.  Direct examination of the colon should be repeated every 5-10 years through 36 years of age. However, you may need to be screened more often if early forms of precancerous polyps or small growths are found. Skin Cancer  Check your skin from head to toe regularly.  Tell your health care provider about any new moles or changes in moles, especially if there is a change in a mole's shape or color.  Also tell your health care provider if you have a mole that is larger than the size of a pencil eraser.  Always use sunscreen. Apply sunscreen liberally and repeatedly throughout the day.  Protect yourself by wearing long sleeves, pants, a wide-brimmed hat, and sunglasses whenever you are outside. Heart disease, diabetes, and high blood pressure  High blood pressure causes heart disease and increases the risk of stroke. High blood pressure is more likely  to develop in: ? People who have blood pressure in the high end of the normal range (130-139/85-89 mm Hg). ? People who are overweight or obese. ? People who are African American.  If you are 59-12 years of age, have your blood pressure checked every 3-5 years. If you are 24 years of age or older, have your blood pressure checked every year. You should have your blood pressure measured twice-once when you are at a hospital or clinic, and once when you are not at a hospital or clinic. Record the average of the two measurements. To check your blood pressure when you are not at a hospital or clinic, you can use: ? An automated blood pressure machine at a pharmacy. ? A home blood pressure monitor.  If you are between 31 years and 38 years old, ask your health care provider if you should take aspirin to prevent strokes.  Have regular diabetes screenings. This involves taking a blood sample to check your  fasting blood sugar level. ? If you are at a normal weight and have a low risk for diabetes, have this test once every three years after 36 years of age. ? If you are overweight and have a high risk for diabetes, consider being tested at a younger age or more often. Preventing infection Hepatitis B  If you have a higher risk for hepatitis B, you should be screened for this virus. You are considered at high risk for hepatitis B if: ? You were born in a country where hepatitis B is common. Ask your health care provider which countries are considered high risk. ? Your parents were born in a high-risk country, and you have not been immunized against hepatitis B (hepatitis B vaccine). ? You have HIV or AIDS. ? You use needles to inject street drugs. ? You live with someone who has hepatitis B. ? You have had sex with someone who has hepatitis B. ? You get hemodialysis treatment. ? You take certain medicines for conditions, including cancer, organ transplantation, and autoimmune conditions. Hepatitis C  Blood testing is recommended for: ? Everyone born from [birth year] through 1963. ? Anyone with known risk factors for hepatitis C. Sexually transmitted infections (STIs)  You should be screened for sexually transmitted infections (STIs) including gonorrhea and chlamydia if: ? You are sexually active and are younger than 36 years of age. ? You are older than 36 years of age and your health care provider tells you that you are at risk for this type of infection. ? Your sexual activity has changed since you were last screened and you are at an increased risk for chlamydia or gonorrhea. Ask your health care provider if you are at risk.  If you do not have HIV, but are at risk, it may be recommended that you take a prescription medicine daily to prevent HIV infection. This is called pre-exposure prophylaxis (PrEP). You are considered at risk if: ? You are sexually active and do not regularly use condoms or  know the HIV status of your partner(s). ? You take drugs by injection. ? You are sexually active with a partner who has HIV. Talk with your health care provider about whether you are at high risk of being infected with HIV. If you choose to begin PrEP, you should first be tested for HIV. You should then be tested every 3 months for as long as you are taking PrEP. Pregnancy  If you are premenopausal and you may become pregnant, ask your  health care provider about preconception counseling.  If you may become pregnant, take 400 to 800 micrograms (mcg) of folic acid every day.  If you want to prevent pregnancy, talk to your health care provider about birth control (contraception). Osteoporosis and menopause  Osteoporosis is a disease in which the bones lose minerals and strength with aging. This can result in serious bone fractures. Your risk for osteoporosis can be identified using a bone density scan.  If you are 22 years of age or older, or if you are at risk for osteoporosis and fractures, ask your health care provider if you should be screened.  Ask your health care provider whether you should take a calcium or vitamin D supplement to lower your risk for osteoporosis.  Menopause may have certain physical symptoms and risks.  Hormone replacement therapy may reduce some of these symptoms and risks. Talk to your health care provider about whether hormone replacement therapy is right for you. Follow these instructions at home:  Schedule regular health, dental, and eye exams.  Stay current with your immunizations.  Do not use any tobacco products including cigarettes, chewing tobacco, or electronic cigarettes.  If you are pregnant, do not drink alcohol.  If you are breastfeeding, limit how much and how often you drink alcohol.  Limit alcohol intake to no more than 1 drink per day for nonpregnant women. One drink equals 12 ounces of beer, 5 ounces of wine, or 1 ounces of hard  liquor.  Do not use street drugs.  Do not share needles.  Ask your health care provider for help if you need support or information about quitting drugs.  Tell your health care provider if you often feel depressed.  Tell your health care provider if you have ever been abused or do not feel safe at home. This information is not intended to replace advice given to you by your health care provider. Make sure you discuss any questions you have with your health care provider. Document Released: 03/15/2011 Document Revised: 02/05/2016 Document Reviewed: 06/03/2015 Elsevier Interactive Patient Education  2019 [employer].

## 2018-10-16 NOTE — Progress Notes (Signed)
Ppd placed today in R forearm. pt tolerated well. Pt advised to RTC in 48-72 hours for reading.Heath Gold, CMA

## 2018-10-16 NOTE — Progress Notes (Signed)
Subjective:     Kristina Harrington is a 38 y.o. female and is here for a comprehensive physical exam. The patient reports problems - URI sxs..  Starting a new job and needs a health form completed.  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  She has started to exercise more regularly and has already lost 10 pounds on her home scale.  She is hoping to run a triathlon with her mother by next year.  And get back into training.  Her daughter went into complete remission and so she is interested in starting to work part-time again.  Reports that she has had some sinus drainage for about a week and in the last 24 hours it has turned more into congestion.  She denies any fevers or chills.  She says she sneezed an enormous amount yesterday she does typically have allergies in the early spring but says it is usually not quite this early.  She has been taking her Zyrtec Claritin and occasional Singulair.  Then more recently she stopped the Singulair because it was giving her such a dry mouth.  He has a Paediatric nurse OB/GYN for her GYN care and sees Dr. Laural Benes yearly.  Social History   Socioeconomic History  . Marital status: Married    Spouse name: Not on file  . Number of children: Not on file  . Years of education: Not on file  . Highest education level: Not on file  Occupational History  . Not on file  Social Needs  . Financial resource strain: Not on file  . Food insecurity:    Worry: Not on file    Inability: Not on file  . Transportation needs:    Medical: Not on file    Non-medical: Not on file  Tobacco Use  . Smoking status: Never Smoker  . Smokeless tobacco: Never Used  Substance and Sexual Activity  . Alcohol use: Yes    Comment: 1-2 drinks a year  . Drug use: No  . Sexual activity: Not on file  Lifestyle  . Physical activity:    Days per week: Not on file    Minutes per session: Not on file  . Stress: Not on file  Relationships  . Social  connections:    Talks on phone: Not on file    Gets together: Not on file    Attends religious service: Not on file    Active member of club or organization: Not on file    Attends meetings of clubs or organizations: Not on file    Relationship status: Not on file  . Intimate partner violence:    Fear of current or ex partner: Not on file    Emotionally abused: Not on file    Physically abused: Not on file    Forced sexual activity: Not on file  Other Topics Concern  . Not on file  Social History Narrative  . Not on file   Health Maintenance  Topic Date Due  . PAP SMEAR-Modifier  04/15/2014  . TETANUS/TDAP  09/15/2021  . INFLUENZA VACCINE  Completed  . HIV Screening  Completed    The following portions of the patient's history were reviewed and updated as appropriate: allergies, current medications, past family history, past medical history, past social history, past surgical history and problem list.  Review of Systems A comprehensive review of systems was negative.   Objective:    BP (!) 122/59  Pulse 67   Ht 5\' 9"  (1.753 m)   Wt 244 lb (110.7 kg)   SpO2 100%   BMI 36.03 kg/m  General appearance: alert, cooperative and appears stated age Head: Normocephalic, without obvious abnormality, atraumatic Eyes: conj clear, EOMI< PEERLA Ears: normal TM's and external ear canals both ears Nose: Nares normal. Septum midline. Mucosa normal. No drainage or sinus tenderness. Throat: lips, mucosa, and tongue normal; teeth and gums normal Neck: no adenopathy, no carotid bruit, no JVD, supple, symmetrical, trachea midline and thyroid not enlarged, symmetric, no tenderness/mass/nodules Back: symmetric, no curvature. ROM normal. No CVA tenderness. Lungs: clear to auscultation bilaterally Heart: regular rate and rhythm, S1, S2 normal, no murmur, click, rub or gallop Abdomen: soft, non-tender; bowel sounds normal; no masses,  no organomegaly Extremities: extremities normal,  atraumatic, no cyanosis or edema Pulses: 2+ and symmetric Skin: Skin color, texture, turgor normal. No rashes or lesions Lymph nodes: Cervical adenopathy: nl and Supraclavicular adenopathy: nl Neurologic: Alert and oriented X 3, normal strength and tone. Normal symmetric reflexes. Normal coordination and gait    Assessment:    Healthy female exam.      Plan:     See After Visit Summary for Counseling Recommendations   Keep up a regular exercise program and make sure you are eating a healthy diet Try to eat 4 servings of dairy a day, or if you are lactose intolerant take a calcium with vitamin D daily.  Your vaccines are up to date.

## 2018-10-18 ENCOUNTER — Ambulatory Visit (INDEPENDENT_AMBULATORY_CARE_PROVIDER_SITE_OTHER): Payer: Managed Care, Other (non HMO) | Admitting: Family Medicine

## 2018-10-18 VITALS — BP 116/66 | HR 66 | Temp 98.2°F | Wt 244.0 lb

## 2018-10-18 DIAGNOSIS — Z111 Encounter for screening for respiratory tuberculosis: Secondary | ICD-10-CM

## 2018-10-18 LAB — TB SKIN TEST
INDURATION: 0 mm
TB Skin Test: NEGATIVE

## 2018-10-18 NOTE — Progress Notes (Signed)
PPD Reading Note PPD read and results entered in Epic[employer]. Result: 0 mm induration. Interpretation: Negative Allergic reaction: no     Pt was given her forms that Dr. Linford Arnold had filled out.

## 2018-10-18 NOTE — Progress Notes (Signed)
They are in Tonya's basket and signed.  Just fill out TB part.

## 2018-10-31 ENCOUNTER — Encounter: Payer: Self-pay | Admitting: Family Medicine

## 2018-10-31 ENCOUNTER — Ambulatory Visit (INDEPENDENT_AMBULATORY_CARE_PROVIDER_SITE_OTHER): Payer: Managed Care, Other (non HMO) | Admitting: Family Medicine

## 2018-10-31 VITALS — BP 118/75 | HR 101 | Temp 99.5°F | Ht 69.0 in | Wt 240.0 lb

## 2018-10-31 DIAGNOSIS — R509 Fever, unspecified: Secondary | ICD-10-CM

## 2018-10-31 DIAGNOSIS — J101 Influenza due to other identified influenza virus with other respiratory manifestations: Secondary | ICD-10-CM

## 2018-10-31 LAB — POCT INFLUENZA A/B
Influenza A, POC: POSITIVE — AB
Influenza B, POC: NEGATIVE

## 2018-10-31 MED ORDER — OSELTAMIVIR PHOSPHATE 75 MG PO CAPS: 75.0000 mg | ORAL_CAPSULE | Freq: Two times a day (BID) | ORAL | 0 refills | Status: DC

## 2018-10-31 NOTE — Progress Notes (Signed)
Acute Office Visit  Subjective:    Patient ID: Kristina Harrington, female    DOB: 26-Aug-1981, 38 y.o.   MRN: [id number]  Chief Complaint  Patient presents with  . Sinusitis  . Fever    HPI Patient is in today for sinus congestion, cough and postnasal drip started on Sunday afternoon..  She has been taking an antihistamine and some Mucinex.  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  And started running a fever today.  No GI symptoms.  Highest fever was 102.  Past Medical History:  Diagnosis Date  . Allergic rhinitis 12/11/2014   Dymista, singulair, claritin.    [name] Hyperlipidemia 08/02/2016  . Lumbar degenerative disc disease 09/20/2013    Past Surgical History:  Procedure Laterality Date  . ARTHROSCOPIC REPAIR ACL  03/2015  . KNEE SURGERY Left    ACL    No family history on file.  Social History   Socioeconomic History  . Marital status: Married    Spouse name: Not on file  . Number of children: Not on file  . Years of education: Not on file  . Highest education level: Not on file  Occupational History  . Not on file  Social Needs  . Financial resource strain: Not on file  . Food insecurity:    Worry: Not on file    Inability: Not on file  . Transportation needs:    Medical: Not on file    Non-medical: Not on file  Tobacco Use  . Smoking status: Never Smoker  . Smokeless tobacco: Never Used  Substance and Sexual Activity  . Alcohol use: Yes    Comment: 1-2 drinks a year  . Drug use: No  . Sexual activity: Not on file  Lifestyle  . Physical activity:    Days per week: Not on file    Minutes per session: Not on file  . Stress: Not on file  Relationships  . Social connections:    Talks on phone: Not on file    Gets together: Not on file    Attends religious service: Not on file    Active member of club or organization: Not on file    Attends meetings of clubs or organizations: Not on file    Relationship status: Not on file  . Intimate partner violence:   Fear of current or ex partner: Not on file    Emotionally abused: Not on file    Physically abused: Not on file    Forced sexual activity: Not on file  Other Topics Concern  . Not on file  Social History Narrative  . Not on file    Outpatient Medications Prior to Visit  Medication Sig Dispense Refill  . atorvastatin (LIPITOR) 10 MG tablet Take 1 tablet (10 mg total) by mouth daily. 90 tablet 3  . Azelastine HCl 0.15 % SOLN     . CAMRESE 0.15-0.03 &0.01 MG tablet     . dicyclomine (BENTYL) 20 MG tablet Take 1 tablet (20 mg total) by mouth 2 (two) times daily. 10 tablet 0  . loratadine (CLARITIN) 10 MG tablet Take 10 mg by mouth daily.    . montelukast (SINGULAIR) 10 MG tablet Take 10 mg by mouth at bedtime.    . sertraline (ZOLOFT) 100 MG tablet TAKE 1 TABLET BY MOUTH ONCE DAILY 90 tablet 1   No facility-administered medications prior to visit.     No Known Allergies  ROS     Objective:    Physical Exam  Constitutional: She is oriented to person, place, and time. She appears well-developed and well-nourished.  HENT:  Head: Normocephalic and atraumatic.  Right Ear: External ear normal.  Left Ear: External ear normal.  Nose: Nose normal.  Mouth/Throat: Oropharynx is clear and moist.  TMs and canals are clear.   Eyes: Pupils are equal, round, and reactive to light. Conjunctivae and EOM are normal.  Neck: Neck supple. No thyromegaly present.  Cardiovascular: Normal rate, regular rhythm and normal heart sounds.  Pulmonary/Chest: Effort normal and breath sounds normal. She has no wheezes.  Lymphadenopathy:    She has no cervical adenopathy.  Neurological: She is alert and oriented to person, place, and time.  Skin: Skin is warm and dry.  Psychiatric: She has a normal mood and affect.    BP 118/75   Pulse (!) 101   Temp 99.5 F (37.5 C)   Ht 5\' 9"  (1.753 m)   Wt 240 lb (108.9 kg)   SpO2 98%   BMI 35.44 kg/m  Wt Readings from Last 3 Encounters:  10/31/18 240 lb  (108.9 kg)  10/18/18 244 lb (110.7 kg)  10/16/18 244 lb (110.7 kg)    Health Maintenance Due  Topic Date Due  . PAP SMEAR-Modifier  04/15/2014    There are no preventive care reminders to display for this patient.   Lab Results  Component Value Date   TSH 1.58 08/14/2018   Lab Results  Component Value Date   WBC 7.1 08/14/2018   HGB 14.4 08/14/2018   HCT 41.4 08/14/2018   MCV 95.2 08/14/2018   PLT 273 08/14/2018   Lab Results  Component Value Date   NA 140 08/14/2018   K 4.2 08/14/2018   CO2 28 08/14/2018   GLUCOSE 86 08/14/2018   BUN 11 08/14/2018   CREATININE 0.80 08/14/2018   BILITOT 0.6 08/14/2018   ALKPHOS 121 (H) 08/19/2016   AST 94 (H) 08/14/2018   ALT 121 (H) 08/14/2018   PROT 7.4 08/14/2018   ALBUMIN 3.9 08/19/2016   CALCIUM 9.9 08/14/2018   Lab Results  Component Value Date   CHOL 285 (H) 08/14/2018   Lab Results  Component Value Date   HDL 49 (L) 08/14/2018   Lab Results  Component Value Date   LDLCALC 193 (H) 08/14/2018   Lab Results  Component Value Date   TRIG 234 (H) 08/14/2018   Lab Results  Component Value Date   CHOLHDL 5.8 (H) 08/14/2018   No results found for: HGBA1C     Assessment & Plan:   Problem List Items Addressed This Visit    None    Visit Diagnoses    Fever and chills    -  Primary   Relevant Orders   POCT Influenza A/B (Completed)   Influenza A       Relevant Medications   oseltamivir (TAMIFLU) 75 MG capsule     Flu Wednesday-discussed options.  Will treat with Tamiflu.  Cal if not better in one week. Ok for symptomatic care.    Meds ordered this encounter  Medications  . oseltamivir (TAMIFLU) 75 MG capsule    Sig: Take 1 capsule (75 mg total) by mouth 2 (two) times daily.    Dispense:  10 capsule    Refill:  0     [name], MD

## 2018-10-31 NOTE — Patient Instructions (Signed)

## 2018-11-13 ENCOUNTER — Ambulatory Visit (INDEPENDENT_AMBULATORY_CARE_PROVIDER_SITE_OTHER): Payer: Managed Care, Other (non HMO) | Admitting: Family Medicine

## 2018-11-13 ENCOUNTER — Encounter: Payer: Self-pay | Admitting: Family Medicine

## 2018-11-13 VITALS — BP 120/70 | HR 72 | Temp 98.7°F | Ht 69.0 in | Wt 242.0 lb

## 2018-11-13 DIAGNOSIS — J208 Acute bronchitis due to other specified organisms: Secondary | ICD-10-CM | POA: Diagnosis not present

## 2018-11-13 MED ORDER — BENZONATATE 200 MG PO CAPS: 200.0000 mg | ORAL_CAPSULE | Freq: Three times a day (TID) | ORAL | 1 refills | Status: DC | PRN

## 2018-11-13 NOTE — Progress Notes (Signed)
Kristina Harrington is a 38 y.o. female who presents to [name]: Primary Care Sports Medicine today for   Cough and hoarse voice starting last week. Feels fatigue. Cough interferes with sleep.  Tried muccinex for cough which helps some.  She thinks she may have gotten sick from exposure to small children.  Patient was seen on February 18 where she tested positive for influenza A.  She was treated with Tamiflu.  She felt mostly completely better before she got sick again last week.  She denies significant shortness of breath chest pain palpitations fevers chills vomiting or diarrhea. ROS as above:  Exam:  BP 120/70   Pulse 72   Temp 98.7 F (37.1 C) (Oral)   Ht 5\' 9"  (1.753 m)   Wt 242 lb (109.8 kg)   SpO2 97%   BMI 35.74 kg/m  Wt Readings from Last 5 Encounters:  11/13/18 242 lb (109.8 kg)  10/31/18 240 lb (108.9 kg)  10/18/18 244 lb (110.7 kg)  10/16/18 244 lb (110.7 kg)  07/07/18 244 lb (110.7 kg)    Gen: Well NAD HEENT: EOMI,  MMM clear nasal discharge.  Normal posterior pharynx.  Mild cervical lymphadenopathy.  Lungs: Normal work of breathing. CTABL Heart: RRR no MRG Abd: NABS, Soft. Nondistended, Nontender Exts: Brisk capillary refill, warm and well perfused.   Lab and Radiology Results No results found for this or any previous visit (from the past 72 hour(s)). No results found.    Assessment and Plan: 38 y.o. female with viral bronchitis likely.  Empiric treatment with over-the-counter medications.  Maximize Mucinex DM and cough drops.  Use Tessalon Perles as well.  If needed will prescribe opiate-based cough medications.  Additionally will consider further treatments if needed in the future patient developed second sickening symptoms.  PDMP not reviewed this encounter. No orders of the defined types were placed in this encounter.  No orders of the defined types were  placed in this encounter.    Historical information moved to improve visibility of documentation.  Past Medical History:  Diagnosis Date  . Allergic rhinitis 12/11/2014   Dymista, singulair, claritin.    [name] Hyperlipidemia 08/02/2016  . Lumbar degenerative disc disease 09/20/2013   Past Surgical History:  Procedure Laterality Date  . ARTHROSCOPIC REPAIR ACL  03/2015  . KNEE SURGERY Left    ACL   Social History   Tobacco Use  . Smoking status: Never Smoker  . Smokeless tobacco: Never Used  Substance Use Topics  . Alcohol use: Yes    Comment: 1-2 drinks a year   family history is not on file.  Medications: Current Outpatient Medications  Medication Sig Dispense Refill  . atorvastatin (LIPITOR) 10 MG tablet Take 1 tablet (10 mg total) by mouth daily. 90 tablet 3  . Azelastine HCl 0.15 % SOLN     . CAMRESE 0.15-0.03 &0.01 MG tablet     . dicyclomine (BENTYL) 20 MG tablet Take 1 tablet (20 mg total) by mouth 2 (two) times daily. 10 tablet 0  . loratadine (CLARITIN) 10 MG tablet Take 10 mg by mouth daily.    . montelukast (SINGULAIR) 10 MG tablet Take 10 mg by mouth at bedtime.    . sertraline (ZOLOFT) 100 MG tablet TAKE 1 TABLET BY MOUTH ONCE DAILY 90 tablet 1   No current facility-administered medications for this visit.    No Known Allergies   Discussed warning signs or symptoms. Please see discharge instructions. Patient  expresses understanding.

## 2018-11-13 NOTE — Patient Instructions (Signed)
Thank you for coming in today. Maximize over the counter medciine.  Use Muccinex DM extended release twice daily.  Dextromethorphan HBr (60 mg), Guaifenesin (1200 mg) Use cough drops with menthol.  Use tessalon pearles as needed up to 3x daily.  Let me know if your cough changes or is not controlled. We can send in opiate based cough medicines as needed.   Call or go to the emergency room if you get worse, have trouble breathing, have chest pains, or palpitations.    Acute Bronchitis, Adult  Acute bronchitis is sudden (acute) swelling of the air tubes (bronchi) in the lungs. Acute bronchitis causes these tubes to fill with mucus, which can make it hard to breathe. It can also cause coughing or wheezing. In adults, acute bronchitis usually goes away within 2 weeks. A cough caused by bronchitis may last up to 3 weeks. Smoking, allergies, and asthma can make the condition worse. Repeated episodes of bronchitis may cause further lung problems, such as chronic obstructive pulmonary disease (COPD). What are the causes? This condition can be caused by germs and by substances that irritate the lungs, including:  Cold and flu viruses. This condition is most often caused by the same virus that causes a cold.  Bacteria.  Exposure to tobacco smoke, dust, fumes, and air pollution. What increases the risk? This condition is more likely to develop in people who:  Have close contact with someone with acute bronchitis.  Are exposed to lung irritants, such as tobacco smoke, dust, fumes, and vapors.  Have a weak immune system.  Have a respiratory condition such as asthma. What are the signs or symptoms? Symptoms of this condition include:  A cough.  Coughing up clear, yellow, or green mucus.  Wheezing.  Chest congestion.  Shortness of breath.  A fever.  Body aches.  Chills.  A sore throat. How is this diagnosed? This condition is usually diagnosed with a physical exam. During the  exam, your health care provider may order tests, such as chest X-rays, to rule out other conditions. He or she may also:  Test a sample of your mucus for bacterial infection.  Check the level of oxygen in your blood. This is done to check for pneumonia.  Do a chest X-ray or lung function testing to rule out pneumonia and other conditions.  Perform blood tests. Your health care provider will also ask about your symptoms and medical history. How is this treated? Most cases of acute bronchitis clear up over time without treatment. Your health care provider may recommend:  Drinking more fluids. Drinking more makes your mucus thinner, which may make it easier to breathe.  Taking a medicine for a fever or cough.  Taking an antibiotic medicine.  Using an inhaler to help improve shortness of breath and to control a cough.  Using a cool mist vaporizer or humidifier to make it easier to breathe. Follow these instructions at home: Medicines  Take over-the-counter and prescription medicines only as told by your health care provider.  If you were prescribed an antibiotic, take it as told by your health care provider. Do not stop taking the antibiotic even if you start to feel better. General instructions   Get plenty of rest.  Drink enough fluids to keep your urine pale yellow.  Avoid smoking and secondhand smoke. Exposure to cigarette smoke or irritating chemicals will make bronchitis worse. If you smoke and you need help quitting, ask your health care provider. Quitting smoking will help your lungs  heal faster.  Use an inhaler, cool mist vaporizer, or humidifier as told by your health care provider.  Keep all follow-up visits as told by your health care provider. This is important. How is this prevented? To lower your risk of getting this condition again:  Wash your hands often with soap and water. If soap and water are not available, use hand sanitizer.  Avoid contact with people  who have cold symptoms.  Try not to touch your hands to your mouth, nose, or eyes.  Make sure to get the flu shot every year. Contact a health care provider if:  Your symptoms do not improve in 2 weeks of treatment. Get help right away if:  You cough up blood.  You have chest pain.  You have severe shortness of breath.  You become dehydrated.  You faint or keep feeling like you are going to faint.  You keep vomiting.  You have a severe headache.  Your fever or chills gets worse. This information is not intended to replace advice given to you by your health care provider. Make sure you discuss any questions you have with your health care provider. Document Released: 10/07/2004 Document Revised: 04/13/2017 Document Reviewed: 02/18/2016 Elsevier Interactive Patient Education  2019 [employer].

## 2018-11-14 ENCOUNTER — Telehealth: Payer: Self-pay

## 2018-11-14 MED ORDER — HYDROCOD POLST-CPM POLST ER 10-8 MG/5ML PO SUER: 5.0000 mL | Freq: Two times a day (BID) | ORAL | 0 refills | Status: DC | PRN

## 2018-11-14 NOTE — Telephone Encounter (Signed)
Patient was seen on yesterday for cough symptoms, she stated that she is not getting any better and she did not rest well on last night. Patient is requesting a Rx be sent to pharmacy for cough. Rhonda Cunningham,CMA

## 2018-11-14 NOTE — Telephone Encounter (Signed)
Patient has been advised. Rhonda Cunningham,CMA  

## 2018-11-14 NOTE — Telephone Encounter (Signed)
Hydrocodone-based cough medication sent to pharmacy.  Take mostly at bedtime. PDMP reviewed during this encounter.

## 2018-11-17 ENCOUNTER — Emergency Department
Admission: EM | Admit: 2018-11-17 | Discharge: 2018-11-17 | Disposition: A | Discharge status: Home / Self Care | Payer: Managed Care, Other (non HMO) | Source: Home / Self Care

## 2018-11-17 ENCOUNTER — Other Ambulatory Visit: Payer: Self-pay

## 2018-11-17 DIAGNOSIS — J069 Acute upper respiratory infection, unspecified: Secondary | ICD-10-CM

## 2018-11-17 DIAGNOSIS — J209 Acute bronchitis, unspecified: Secondary | ICD-10-CM | POA: Diagnosis not present

## 2018-11-17 DIAGNOSIS — J040 Acute laryngitis: Secondary | ICD-10-CM

## 2018-11-17 DIAGNOSIS — H6691 Otitis media, unspecified, right ear: Secondary | ICD-10-CM

## 2018-11-17 MED ORDER — IPRATROPIUM BROMIDE 0.06 % NA SOLN: 2.0000 | Freq: Four times a day (QID) | NASAL | 1 refills | Status: DC

## 2018-11-17 MED ORDER — AMOXICILLIN-POT CLAVULANATE 875-125 MG PO TABS: 1.0000 | ORAL_TABLET | Freq: Two times a day (BID) | ORAL | 0 refills | Status: DC

## 2018-11-17 MED ORDER — PREDNISONE 50 MG PO TABS: 50.0000 mg | ORAL_TABLET | Freq: Every day | ORAL | 0 refills | Status: AC

## 2018-11-17 NOTE — ED Triage Notes (Signed)
Pt sick 3 weeks ago with flu A. Coughing ever since.  Voice is very hoarse. Not sleeping well due to cough. RT ear pain starting today. Saw PCP Monday and was given rx for cough solution and tessalon pills. Also taking mucinex prn.

## 2018-11-17 NOTE — ED Provider Notes (Signed)
[facility]    CSN: [id number] Arrival date & time: 11/17/18  1908     History   Chief Complaint Chief Complaint  Patient presents with  . Bronchitis    HPI Kristina Harrington is a 38 y.o. female.   HPI  Kristina Harrington is a 38 y.o. female presenting to UC with c/o worsening hoarse voice, chest congestion, productive cough and now Right eat pain that started today. She was dx with influenza A 3 weeks ago. She was seen by her PCP 1 week ago, tx with prescription cough syrup and tessalon pills for her cough that's keeping her up at night. Minimal relief.  Denies known fever. Mild burning in her chest but no SOB.    Past Medical History:  Diagnosis Date  . Allergic rhinitis 12/11/2014   Dymista, singulair, claritin.    [name] Hyperlipidemia 08/02/2016  . Lumbar degenerative disc disease 09/20/2013    Patient Active Problem List   Diagnosis Date Noted  . Acute stress reaction 08/02/2016  . Hyperlipidemia 08/02/2016  . Allergic rhinitis 12/11/2014  . Iliotibial band syndrome of right side 11/02/2013  . Lumbar degenerative disc disease 09/20/2013    Past Surgical History:  Procedure Laterality Date  . ARTHROSCOPIC REPAIR ACL  03/2015  . KNEE SURGERY Left    ACL    OB History   No obstetric history on file.      Home Medications    Prior to Admission medications   Medication Sig Start Date End Date Taking? Authorizing Provider  amoxicillin-clavulanate (AUGMENTIN) 875-125 MG tablet Take 1 tablet by mouth 2 (two) times daily. One po bid x 7 days 11/17/18   Lurene Shadow, PA-C  atorvastatin (LIPITOR) 10 MG tablet Take 1 tablet (10 mg total) by mouth daily. 08/15/18   Agapito Games, MD  Azelastine HCl 0.15 % SOLN  06/11/16   [provider]  benzonatate (TESSALON) 200 MG capsule Take 1 capsule (200 mg total) by mouth 3 (three) times daily as needed for cough. 11/13/18   Rodolph Bong, MD  CAMRESE 0.15-0.03 &0.01 MG tablet  05/24/16   [provider]  chlorpheniramine-HYDROcodone (TUSSIONEX) 10-8 MG/5ML SUER Take 5 mLs by mouth every 12 (twelve) hours as needed for cough (cough, will cause drowsiness.). 11/14/18   Rodolph Bong, MD  dicyclomine (BENTYL) 20 MG tablet Take 1 tablet (20 mg total) by mouth 2 (two) times daily. 09/27/17   Elson Areas, PA-C  ipratropium (ATROVENT) 0.06 % nasal spray Place 2 sprays into both nostrils 4 (four) times daily. 11/17/18   Lurene Shadow, PA-C  loratadine (CLARITIN) 10 MG tablet Take 10 mg by mouth daily.    [provider]  predniSONE (DELTASONE) 50 MG tablet Take 1 tablet (50 mg total) by mouth daily with breakfast for 5 days. 11/17/18 11/22/18  Lurene Shadow, PA-C  sertraline (ZOLOFT) 100 MG tablet TAKE 1 TABLET BY MOUTH ONCE DAILY 08/14/18   Agapito Games, MD    Family History History reviewed. No pertinent family history.  Social History Social History   Tobacco Use  . Smoking status: Never Smoker  . Smokeless tobacco: Never Used  Substance Use Topics  . Alcohol use: Yes    Comment: 1-2 drinks a year  . Drug use: No     Allergies   Patient has no known allergies.   Review of Systems Review of Systems  Constitutional: Negative for chills and fever.  HENT: Positive for congestion, ear pain (  Right), sore throat and voice change. Negative for trouble swallowing.   Respiratory: Positive for cough. Negative for shortness of breath.   Cardiovascular: Positive for chest pain (burning with cough). Negative for palpitations.  Gastrointestinal: Negative for abdominal pain, diarrhea, nausea and vomiting.  Musculoskeletal: Negative for arthralgias, back pain and myalgias.  Skin: Negative for rash.  Neurological: Positive for headaches. Negative for dizziness and light-headedness.     Physical Exam Triage Vital Signs ED Triage Vitals  Enc Vitals Group     BP 11/17/18 1923 124/74     Pulse Rate 11/17/18 1923 88     Resp 11/17/18 1923 16     Temp 11/17/18 1923 98.7 F  (37.1 C)     Temp Source 11/17/18 1923 Oral     SpO2 11/17/18 1923 98 %     Weight 11/17/18 1924 240 lb 4.8 oz (109 kg)     Height 11/17/18 1924  (1.753 m)     Head Circumference --      Peak Flow --      Pain Score 11/17/18 1924 0     Pain Loc --      Pain Edu? --      Excl. in GC? --    No data found.  Updated Vital Signs BP 124/74 (BP Location: Right Arm)   Pulse 88   Temp 98.7 F (37.1 C) (Oral)   Resp 16   Ht  (1.753 m)   Wt 240 lb 4.8 oz (109 kg)   LMP 11/02/2018   SpO2 98%   BMI 35.49 kg/m   Visual Acuity Right Eye Distance:   Left Eye Distance:   Bilateral Distance:    Right Eye Near:   Left Eye Near:    Bilateral Near:     Physical Exam Vitals signs and nursing note reviewed.  Constitutional:      Appearance: Normal appearance. She is well-developed.  HENT:     Head: Normocephalic and atraumatic.     Right Ear: Tympanic membrane is erythematous and bulging.     Left Ear: Tympanic membrane normal.     Nose: Congestion present.     Right Sinus: No maxillary sinus tenderness or frontal sinus tenderness.     Left Sinus: No maxillary sinus tenderness or frontal sinus tenderness.     Mouth/Throat:     Lips: Pink.     Mouth: Mucous membranes are moist.     Pharynx: Oropharynx is clear. Uvula midline.  Neck:     Musculoskeletal: Normal range of motion.  Cardiovascular:     Rate and Rhythm: Normal rate and regular rhythm.  Pulmonary:     Effort: Pulmonary effort is normal. No respiratory distress.     Breath sounds: No stridor. Rhonchi (faint, diffuse) present. No wheezing.     Comments: Hoarse voice but no stridor Musculoskeletal: Normal range of motion.  Skin:    General: Skin is warm and dry.  Neurological:     Mental Status: She is alert and oriented to person, place, and time.  Psychiatric:        Behavior: Behavior normal.      UC Treatments / Results  Labs (all labs ordered are listed, but only abnormal results are  displayed) Labs Reviewed - No data to display  EKG None  Radiology No results found.  Procedures Procedures (including critical care time)  Medications Ordered in UC Medications - No data to display  Initial Impression / Assessment and Plan / UC  Course  I have reviewed the triage vital signs and the nursing notes.  Pertinent labs & imaging results that were available during my care of the patient were reviewed by me and considered in my medical decision making (see chart for details).     Hx and exam c/w acute bronchitis with Right AOM and laryngitis Will tx with antibiotics.  Final Clinical Impressions(s) / UC Diagnoses   Final diagnoses:  Acute bronchitis, unspecified organism  Acute upper respiratory infection  Right acute otitis media  Laryngitis     Discharge Instructions      Please take antibiotics as prescribed and be sure to complete entire course even if you start to feel better to ensure infection does not come back.  You may take 500mg  acetaminophen every 4-6 hours or in combination with ibuprofen 400-600mg  every 6-8 hours as needed for pain, inflammation, and fever.  Be sure to well hydrated with clear liquids and get at least 8 hours of sleep at night, preferably more while sick.   Please follow up with family medicine in 1 week if needed.      ED Prescriptions    Medication Sig Dispense Auth. Provider   amoxicillin-clavulanate (AUGMENTIN) 875-125 MG tablet Take 1 tablet by mouth 2 (two) times daily. One po bid x 7 days 14 tablet Bradley Bostelman O, PA-C   predniSONE (DELTASONE) 50 MG tablet Take 1 tablet (50 mg total) by mouth daily with breakfast for 5 days. 5 tablet Waylan Rocher O, PA-C   ipratropium (ATROVENT) 0.06 % nasal spray Place 2 sprays into both nostrils 4 (four) times daily. 15 mL Lurene Shadow, PA-C     Controlled Substance Prescriptions [place] Controlled Substance Registry consulted? Not Applicable   Rolla Plate 11/18/18  6333

## 2018-11-17 NOTE — Discharge Instructions (Signed)

## 2019-03-29 ENCOUNTER — Encounter: Payer: Self-pay | Admitting: Family Medicine

## 2019-03-29 ENCOUNTER — Ambulatory Visit (INDEPENDENT_AMBULATORY_CARE_PROVIDER_SITE_OTHER): Payer: Managed Care, Other (non HMO) | Admitting: Family Medicine

## 2019-03-29 ENCOUNTER — Other Ambulatory Visit: Payer: Self-pay

## 2019-03-29 VITALS — BP 118/80 | HR 66 | Temp 98.7°F | Ht 69.0 in | Wt 230.4 lb

## 2019-03-29 DIAGNOSIS — R748 Abnormal levels of other serum enzymes: Secondary | ICD-10-CM | POA: Diagnosis not present

## 2019-03-29 DIAGNOSIS — F430 Acute stress reaction: Secondary | ICD-10-CM

## 2019-03-29 DIAGNOSIS — F5102 Adjustment insomnia: Secondary | ICD-10-CM

## 2019-03-29 NOTE — Progress Notes (Signed)
Virtual Visit via Video Note  I connected with Kristina Harrington on 03/29/19 at  8:50 AM EDT by a video enabled telemedicine application and verified that I am speaking with the correct person using two identifiers.   I discussed the limitations of evaluation and management by telemedicine and the availability of in person appointments. The patient expressed understanding and agreed to proceed.  Pt was at home and I was in my office for the virtual visit.     Subjective:    CC: Mood, greiving.    HPI: 38 year old female is coming in today because she has just been feeling more down and sad lately.  1 of her friends and neighbors recently just passed away at the age of 5 and it is been very stressful for her.  She is not been sleeping well since it happened.  In addition to just some other stressors going on.  She is not sure that her Zoloft is working well.  This only medication that she is been on.  She says even before this happened she was not feeling like it was working as well as it used to.   Past medical history, Surgical history, Family history not pertinant except as noted below, Social history, Allergies, and medications have been entered into the medical record, reviewed, and corrections made.   Review of Systems: No fevers, chills, night sweats, weight loss, chest pain, or shortness of breath.   Objective:    General: Speaking clearly in complete sentences without any shortness of breath.  Alert and oriented x3.  Normal judgment. No apparent acute distress. Well groomed.     Impression and Recommendations:    Acute stress-discussed options.  We will try increasing her sertraline 250 mg.  New prescription sent to pharmacy.  Follow-up in a couple months or sooner if she feels like she is not improving.  We also discussed referral to therapy/counseling for possible grieving counseling I think this could actually be really helpful for her.  Acute insomnia-secondary to increased  stressors.  Discussed just working on good sleep hygiene in addition to maybe trying some of the over-the-counter product such as melatonin or Unisom.  If still not helpful then please let me know.  Elevated liver enzymes - due to recheck enzymes.    I discussed the assessment and treatment plan with the patient. The patient was provided an opportunity to ask questions and all were answered. The patient agreed with the plan and demonstrated an understanding of the instructions.   The patient was advised to call back or seek an in-person evaluation if the symptoms worsen or if the condition fails to improve as anticipated.   Beatrice Lecher, MD

## 2019-03-29 NOTE — Progress Notes (Signed)
Pt reports that she doesn't feel that her current medication is not working for her.Maryruth Eve, Lahoma Crocker, CMA

## 2019-04-03 ENCOUNTER — Telehealth: Payer: Self-pay

## 2019-04-03 MED ORDER — SERTRALINE HCL 100 MG PO TABS: 150.0000 mg | ORAL_TABLET | Freq: Every day | ORAL | 1 refills | Status: DC

## 2019-04-03 NOTE — Telephone Encounter (Signed)
I apologize.  New prescription sent.  Let us know if there is any problems at the pharmacy.

## 2019-04-03 NOTE — Telephone Encounter (Signed)
Kristina Harrington called and states Dr Madilyn Fireman was going to increase her Sertraline to 150 mg daily. The pharmacy hasn't received the increased dose.

## 2019-04-03 NOTE — Telephone Encounter (Signed)
Left pt msg advising RX at pharmacy  

## 2019-04-11 ENCOUNTER — Encounter: Payer: Self-pay | Admitting: Family Medicine

## 2019-04-11 ENCOUNTER — Ambulatory Visit (INDEPENDENT_AMBULATORY_CARE_PROVIDER_SITE_OTHER): Payer: Managed Care, Other (non HMO) | Admitting: Family Medicine

## 2019-04-11 VITALS — Temp 99.1°F | Wt 231.0 lb

## 2019-04-11 DIAGNOSIS — J0100 Acute maxillary sinusitis, unspecified: Secondary | ICD-10-CM | POA: Diagnosis not present

## 2019-04-11 MED ORDER — AMOXICILLIN-POT CLAVULANATE 875-125 MG PO TABS: 1.0000 | ORAL_TABLET | Freq: Two times a day (BID) | ORAL | 0 refills | Status: DC

## 2019-04-11 NOTE — Progress Notes (Signed)
Virtual Visit via Video Note  I connected with Kristina Harrington on 04/11/19 at 10:10 AM EDT by a video enabled telemedicine application and verified that I am speaking with the correct person using two identifiers.   I discussed the limitations of evaluation and management by telemedicine and the availability of in person appointments. The patient expressed understanding and agreed to proceed.  Pt was at home and I was in my office for the virtual visit.       Subjective:    CC:   HPI: Patient says that starting in the middle of last week she started having some irritation and itching in her left ear and started developing some postnasal drip.  Over the weekend it got worse and developed a sore throat and more pain and discomfort on the left side of the neck.  Now she has a lot of thick nasal congestion as well as some left-sided maxillary pain.  The ear feels a little bit better but she still has some soreness on that side of the neck.  She has been using Afrin, Flonase, Mucinex, Sudafed, Nettie pot, Zicam, elderberry, and eating pineapple to help break up the mucus.  She denies any fevers chills or sweats.  She denies any shortness of breath or loss of taste or smell.  She denies any GI symptoms.   Past medical history, Surgical history, Family history not pertinant except as noted below, Social history, Allergies, and medications have been entered into the medical record, reviewed, and corrections made.   Review of Systems: No fevers, chills, night sweats, weight loss, chest pain, or shortness of breath.   Objective:    General: Speaking clearly in complete sentences without any shortness of breath.  Alert and oriented x3.  Normal judgment. No apparent acute distress. Well groomed.     Impression and Recommendations:    Acute sinusitis -   we will treat with Augmentin.  Call if not better in 1 week.  Okay to continue symptomatic care.    I discussed the assessment and treatment  plan with the patient. The patient was provided an opportunity to ask questions and all were answered. The patient agreed with the plan and demonstrated an understanding of the instructions.   The patient was advised to call back or seek an in-person evaluation if the symptoms worsen or if the condition fails to improve as anticipated.   Beatrice Lecher, MD

## 2019-06-13 ENCOUNTER — Other Ambulatory Visit: Payer: Self-pay

## 2019-06-13 ENCOUNTER — Ambulatory Visit (INDEPENDENT_AMBULATORY_CARE_PROVIDER_SITE_OTHER): Payer: Managed Care, Other (non HMO) | Admitting: Physician Assistant

## 2019-06-13 ENCOUNTER — Encounter: Payer: Self-pay | Admitting: Physician Assistant

## 2019-06-13 VITALS — Temp 98.3°F | Ht 69.0 in | Wt 231.0 lb

## 2019-06-13 DIAGNOSIS — J0100 Acute maxillary sinusitis, unspecified: Secondary | ICD-10-CM | POA: Diagnosis not present

## 2019-06-13 MED ORDER — AMOXICILLIN-POT CLAVULANATE 875-125 MG PO TABS: 1.0000 | ORAL_TABLET | Freq: Two times a day (BID) | ORAL | 0 refills | Status: DC

## 2019-06-13 NOTE — Progress Notes (Signed)
Patient ID: Kristina Harrington, female   DOB: [birth year], 38 y.o.   MRN: [id number] .Kristina KitchenVirtual Visit via Video Note  I connected with Kristina Harrington on 06/13/19 at 11:10 AM EDT by a video enabled telemedicine application and verified that I am speaking with the correct person using two identifiers.  Location: Patient: home Provider: clinic   I discussed the limitations of evaluation and management by telemedicine and the availability of in person appointments. The patient expressed understanding and agreed to proceed.  History of Present Illness: Patient is a 38 year old female with history of biannual sinus infections who calls in today with sinus maxillary pressure, sinus drainage, congestion for the last 6 days.  She has been trying Sudafed, Mucinex, Zyrtec, Claritin, Airborne, Nettie pot, and Flonase.  She is getting minimal relief.  She denies any fevers, cough, shortness of breath, loss of smell or taste, GI symptoms, or body aches.  Patient denies any COVID contact or known sick contacts.  .. Active Ambulatory Problems    Diagnosis Date Noted  . Lumbar degenerative disc disease 09/20/2013  . Iliotibial band syndrome of right side 11/02/2013  . Allergic rhinitis 12/11/2014  . Acute stress reaction 08/02/2016  . Hyperlipidemia 08/02/2016   Resolved Ambulatory Problems    Diagnosis Date Noted  . KNEE PAIN 10/27/2010  . SINUSITIS - ACUTE-NOS 11/30/2010  . Acute maxillary sinusitis 12/06/2013  . Allergic sinusitis 12/11/2014   No Additional Past Medical History   Reviewed med, allergy, problem list.    Observations/Objective: No acute distress. Normal appearance and mood.  No cough during encounter.   .. Today's Vitals   06/13/19 1038  Temp: 98.3 F (36.8 C)  TempSrc: Oral  Weight: 231 lb (104.8 kg)  Height: 5\' 9"  (1.753 m)   Body mass index is 34.11 kg/m.    Assessment and Plan: .Kristina KitchenJori was seen today for nasal congestion.  Diagnoses and all orders for this  visit:  Acute non-recurrent maxillary sinusitis -     amoxicillin-clavulanate (AUGMENTIN) 875-125 MG tablet; Take 1 tablet by mouth 2 (two) times daily.   Treated sinusitis with augmentin. Encouraged to use flonase. Follow up as needed.    Follow Up Instructions:    I discussed the assessment and treatment plan with the patient. The patient was provided an opportunity to ask questions and all were answered. The patient agreed with the plan and demonstrated an understanding of the instructions.   The patient was advised to call back or seek an in-person evaluation if the symptoms worsen or if the condition fails to improve as anticipated.    Kristina Planas, PA-C

## 2019-06-13 NOTE — Progress Notes (Deleted)
Started Saturday: drainage and mucus buildup - yellow No cough, no other symptoms  Has tried sudafed, mucinex, zyrtec, claritin, airbourne, netti pot  Always happens at fall/spring season change

## 2019-08-22 ENCOUNTER — Ambulatory Visit (INDEPENDENT_AMBULATORY_CARE_PROVIDER_SITE_OTHER): Payer: Managed Care, Other (non HMO) | Admitting: Family Medicine

## 2019-08-22 ENCOUNTER — Other Ambulatory Visit: Payer: Self-pay

## 2019-08-22 VITALS — BP 116/72 | HR 76 | Temp 98.6°F | Ht 69.0 in | Wt 230.0 lb

## 2019-08-22 DIAGNOSIS — Z111 Encounter for screening for respiratory tuberculosis: Secondary | ICD-10-CM | POA: Diagnosis not present

## 2019-08-22 NOTE — Progress Notes (Signed)
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 Patient dropped off form that would need to be filled out by PCP and we can give the patient a copy of the results when she comes back in 48-72 hours past the PPD placement. Patient tolerated injection well in left forearm with no immediate complications.

## 2019-08-22 NOTE — Progress Notes (Signed)
Agree with documentation as above.   Catherine Metheney, MD  

## 2019-08-24 ENCOUNTER — Ambulatory Visit (INDEPENDENT_AMBULATORY_CARE_PROVIDER_SITE_OTHER): Payer: Managed Care, Other (non HMO) | Admitting: Family Medicine

## 2019-08-24 ENCOUNTER — Other Ambulatory Visit: Payer: Self-pay

## 2019-08-24 VITALS — Ht 69.0 in

## 2019-08-24 DIAGNOSIS — Z111 Encounter for screening for respiratory tuberculosis: Secondary | ICD-10-CM

## 2019-08-24 LAB — TB SKIN TEST
Induration: 0 mm
TB Skin Test: NEGATIVE

## 2019-08-24 NOTE — Progress Notes (Signed)
Form for work completed.    Beatrice Lecher, MD

## 2019-08-24 NOTE — Progress Notes (Signed)
Patient in for TB skin test read. Patient needs this for her work. The results were negative and 0 mm on the reading from the left forearm. The patient was given a note and a copy of the results for her work. I am going to place a copy in for scanning for this patient. No other concerns at this time.

## 2019-10-07 ENCOUNTER — Other Ambulatory Visit: Payer: Self-pay | Admitting: Family Medicine

## 2019-12-05 ENCOUNTER — Other Ambulatory Visit: Payer: Self-pay | Admitting: Family Medicine

## 2019-12-05 DIAGNOSIS — E785 Hyperlipidemia, unspecified: Secondary | ICD-10-CM

## 2019-12-06 ENCOUNTER — Telehealth (INDEPENDENT_AMBULATORY_CARE_PROVIDER_SITE_OTHER): Payer: Managed Care, Other (non HMO) | Admitting: Family Medicine

## 2019-12-06 ENCOUNTER — Other Ambulatory Visit: Payer: Self-pay

## 2019-12-06 ENCOUNTER — Encounter: Payer: Self-pay | Admitting: Family Medicine

## 2019-12-06 VITALS — Temp 97.5°F

## 2019-12-06 DIAGNOSIS — J019 Acute sinusitis, unspecified: Secondary | ICD-10-CM

## 2019-12-06 MED ORDER — AMOXICILLIN-POT CLAVULANATE 875-125 MG PO TABS: 1.0000 | ORAL_TABLET | Freq: Two times a day (BID) | ORAL | 0 refills | Status: DC

## 2019-12-06 NOTE — Progress Notes (Signed)
Virtual Visit via Video Note  I connected with Kristina Harrington on 12/06/19 at 11:30 AM EDT by a video enabled telemedicine application and verified that I am speaking with the correct person using two identifiers.   I discussed the limitations of evaluation and management by telemedicine and the availability of in person appointments. The patient expressed understanding and agreed to proceed.  Subjective:    CC: Sinus sxs   HPI: Pt was outside on Sunday and by that evening she began to feel stuffy. She has been using the Neti pot, zyrtec,claritin,singular,flonase.  She typically has more allergies and sinus problems in the spring this is not uncommon for her.  She felt like she was getting better over the past few days.  In the beginning she was having clear drainage she states that last night this began to turn yellow.  She has pressure and drainage and R ear pain that started yesterday. She denies any f/s/c/n/v/d/headache. She feels hot however, her face feels "clammy".  She denies any cough.  But she is also noticed a significant change in her voice is much more raspy she is had a lot of postnasal drip.  She does not feel like she is having any throbbing in her face but it is pressure and uncomfortable.  Past medical history, Surgical history, Family history not pertinant except as noted below, Social history, Allergies, and medications have been entered into the medical record, reviewed, and corrections made.   Review of Systems: No fevers, chills, night sweats, weight loss, chest pain, or shortness of breath.   Objective:    General: Speaking clearly in complete sentences without any shortness of breath.  Alert and oriented x3.  Normal judgment. No apparent acute distress.    Impression and Recommendations:    No problem-specific Assessment & Plan notes found for this encounter. Acute sinusitis in combination with allergic rhinitis-continue antihistamines and nasal spray as well  as Nettie pot.  We will go ahead and add Augmentin to her regimen.  If not better after the weekend then please give [place] call back.    Also wanted to know if it is okay to get Covid vaccine on Saturday as long as she is afebrile she is okay to proceed with Covid vaccine.    Time spent in encounter 21 minutes  I discussed the assessment and treatment plan with the patient. The patient was provided an opportunity to ask questions and all were answered. The patient agreed with the plan and demonstrated an understanding of the instructions.   The patient was advised to call back or seek an in-person evaluation if the symptoms worsen or if the condition fails to improve as anticipated.   [name], MD

## 2019-12-06 NOTE — Progress Notes (Signed)
Pt was outside on Sunday and by that evening she began to feel stuffy. She has been using the Neti pot, zyrtec,claritin,singular,flonase. She felt like she was getting better over the past few days.  In the beginning she was having clear drainage she states that last night this began to turn yellow.  She has pressure and drainage and R ear pain that started yesterday. She denies any f/s/c/n/v/d/headache. She feels hot however, her face feels "clammy" .   She did go into work this morning but came home and took a nap.  She has concerns because she is scheduled to get her 2nd COVID vaccine on Saturday and she also has to take her daughter in for a finger stick.

## 2019-12-10 ENCOUNTER — Encounter: Payer: Self-pay | Admitting: Family Medicine

## 2019-12-10 NOTE — Progress Notes (Unsigned)
Negative for intraepithelial lesion or malignancy.  

## 2019-12-30 ENCOUNTER — Other Ambulatory Visit: Payer: Self-pay | Admitting: Family Medicine

## 2020-01-29 ENCOUNTER — Other Ambulatory Visit: Payer: Self-pay | Admitting: Family Medicine

## 2020-01-29 DIAGNOSIS — E785 Hyperlipidemia, unspecified: Secondary | ICD-10-CM

## 2020-02-12 ENCOUNTER — Other Ambulatory Visit: Payer: Self-pay | Admitting: Family Medicine

## 2020-02-12 DIAGNOSIS — E785 Hyperlipidemia, unspecified: Secondary | ICD-10-CM

## 2020-02-27 ENCOUNTER — Other Ambulatory Visit: Payer: Self-pay | Admitting: *Deleted

## 2020-02-27 DIAGNOSIS — E785 Hyperlipidemia, unspecified: Secondary | ICD-10-CM

## 2020-02-27 MED ORDER — ATORVASTATIN CALCIUM 10 MG PO TABS: 10.0000 mg | ORAL_TABLET | Freq: Every day | ORAL | 0 refills | Status: DC

## 2020-02-28 ENCOUNTER — Other Ambulatory Visit: Payer: Self-pay | Admitting: Family Medicine

## 2020-02-28 DIAGNOSIS — E785 Hyperlipidemia, unspecified: Secondary | ICD-10-CM

## 2020-04-10 ENCOUNTER — Other Ambulatory Visit: Payer: Self-pay | Admitting: Family Medicine

## 2020-04-15 ENCOUNTER — Encounter: Payer: Self-pay | Admitting: Family Medicine

## 2020-04-15 ENCOUNTER — Telehealth (INDEPENDENT_AMBULATORY_CARE_PROVIDER_SITE_OTHER): Payer: Managed Care, Other (non HMO) | Admitting: Family Medicine

## 2020-04-15 DIAGNOSIS — J0100 Acute maxillary sinusitis, unspecified: Secondary | ICD-10-CM

## 2020-04-15 MED ORDER — AMOXICILLIN-POT CLAVULANATE 875-125 MG PO TABS: 1.0000 | ORAL_TABLET | Freq: Two times a day (BID) | ORAL | 0 refills | Status: DC

## 2020-04-15 NOTE — Progress Notes (Addendum)
Virtual Visit via Video Note  I connected with Kristina Harrington on 04/15/20 at 10:10 AM EDT by a video enabled telemedicine application and verified that I am speaking with the correct person using two identifiers.   I discussed the limitations of evaluation and management by telemedicine and the availability of in person appointments. The patient expressed understanding and agreed to proceed.  Patient location: home Provider location: in office  Subjective:    CC: sinus sxs.    HPI: She went camping last week and was out near a lot of pine trees and dust.  She said she took her Nettie pot with her.  She is been taking her Singulair and her Claritin.  But started developed some significant congestion particularly in the right maxillary sinus area.  She says it is painful and radiating into her teeth on that side.  She denies any sore throat, fevers, chills.  No eye drainage on that side.  No ear pain or pressure.  She is also been using her nasal steroid spray and is just not getting relief.   Past medical history, Surgical history, Family history not pertinant except as noted below, Social history, Allergies, and medications have been entered into the medical record, reviewed, and corrections made.   Review of Systems: No fevers, chills, night sweats, weight loss, chest pain, or shortness of breath.   Objective:    General: Speaking clearly in complete sentences without any shortness of breath.  Alert and oriented x3.  Normal judgment. No apparent acute distress.    Impression and Recommendations:    No problem-specific Assessment & Plan notes found for this encounter.  Acute sinusitis-We will treat with Augmentin continue with allergy medications.  Avoid adding oral decongestants.  Continue with Nettie pot.  If not improving over the next week then please give [place] call back.    Time spent in encounter 20 minutes  I discussed the assessment and treatment plan with the patient. The  patient was provided an opportunity to ask questions and all were answered. The patient agreed with the plan and demonstrated an understanding of the instructions.   The patient was advised to call back or seek an in-person evaluation if the symptoms worsen or if the condition fails to improve as anticipated.   Kristina Gasser, MD

## 2020-05-12 ENCOUNTER — Encounter: Payer: Self-pay | Admitting: Medical-Surgical

## 2020-05-12 ENCOUNTER — Ambulatory Visit (INDEPENDENT_AMBULATORY_CARE_PROVIDER_SITE_OTHER): Payer: Managed Care, Other (non HMO) | Admitting: Medical-Surgical

## 2020-05-12 VITALS — BP 117/79 | HR 70 | Temp 98.2°F | Ht 69.0 in | Wt 246.7 lb

## 2020-05-12 DIAGNOSIS — F430 Acute stress reaction: Secondary | ICD-10-CM

## 2020-05-12 DIAGNOSIS — R41840 Attention and concentration deficit: Secondary | ICD-10-CM | POA: Diagnosis not present

## 2020-05-12 DIAGNOSIS — Z0000 Encounter for general adult medical examination without abnormal findings: Secondary | ICD-10-CM | POA: Diagnosis not present

## 2020-05-12 DIAGNOSIS — E785 Hyperlipidemia, unspecified: Secondary | ICD-10-CM

## 2020-05-12 MED ORDER — SERTRALINE HCL 100 MG PO TABS: 150.0000 mg | ORAL_TABLET | Freq: Every day | ORAL | 3 refills | Status: DC

## 2020-05-12 MED ORDER — BUPROPION HCL ER (XL) 150 MG PO TB24: 150.0000 mg | ORAL_TABLET | ORAL | 1 refills | Status: DC

## 2020-05-12 MED ORDER — ATORVASTATIN CALCIUM 10 MG PO TABS: 10.0000 mg | ORAL_TABLET | Freq: Every day | ORAL | 3 refills | Status: DC

## 2020-05-12 NOTE — Progress Notes (Signed)
Subjective:    CC: mood follow up  HPI: Pleasant 39 year old female presenting today for a follow up on mood/depression. She has been taking Zoloft since 2016 when her daughter was diagnosed with leukemia shortly after the patient had surgery. There were quite a bit of changes and stresses during that time and she struggled to cope. She started taking Zoloft and feels that it has helped control her depression/anxiety symptoms well. She takes 150mg  daily, tolerates well without side efects. Notes that she tends to be more depressed than anxious in general. Denies SI/HI. Notes that her daughter has been in remission for 3 years as of this year.   ADHD- reports she was diagnoses with ADHD while in elementary school. During her school years, she had IEPs and 504s to accommodate her learning needs. She was treated with several medications over the years including Ritalin, Concerta, Strattera, and Adderall. She tolerated Adderall well but notes it helped her focus but turned her into an emotionless "zombie". She stopped taking medication for ADHD several years ago. Lately, she has noticed a significant lack of focus on a daily basis. Continually gets distracted in the middle of tasks and does not finish the original activity in a timely manner. On top of this, she is also having some increasing forgetfulness. With her return to full time school, her afternoon responsibilities, and keeping up with her daughter's afternoon activities, she feels like it is time to seek treatment again. Would like to start a medication to increase focus but wants to try to avoid the "zombie-like" effect that Adderall had. Thinks that she may have her diagnosis information at home and will bring them for upload into the chart if she can find them.   Hyperlipidemia- taking atorvastatin 10mg  daily, tolerating well without side effects. Has questions about the effect of weight loss and healthy diet on cholesterol and the outlook of  being on the medication for life. High cholesterol has affected several family members and she wonders if she will always have it because of that.   I reviewed the past medical history, family history, social history, surgical history, and allergies today and no changes were needed.  Please see the problem list section below in epic for further details.  Past Medical History: Past Medical History:  Diagnosis Date  . Allergic rhinitis 12/11/2014   Dymista, singulair, claritin.    Hyperlipidemia 08/02/2016  . Lumbar degenerative disc disease 09/20/2013   Past Surgical History: Past Surgical History:  Procedure Laterality Date  . ARTHROSCOPIC REPAIR ACL  03/2015  . KNEE SURGERY Left    ACL   Social History: Social History   Socioeconomic History  . Marital status: Married    Spouse name: Not on file  . Number of children: Not on file  . Years of education: Not on file  . Highest education level: Not on file  Occupational History  . Not on file  Tobacco Use  . Smoking status: Never Smoker  . Smokeless tobacco: Never Used  Vaping Use  . Vaping Use: Never used  Substance and Sexual Activity  . Alcohol use: Yes    Comment: 1-2 drinks a year  . Drug use: No  . Sexual activity: Not on file  Other Topics Concern  . Not on file  Social History Narrative  . Not on file   Social Determinants of Health   Financial Resource Strain:   . Difficulty of Paying Living Expenses: Not on file  Food Insecurity:   .  Worried About Programme researcher, broadcasting/film/video in the Last Year: Not on file  . Ran Out of Food in the Last Year: Not on file  Transportation Needs:   . Lack of Transportation (Medical): Not on file  . Lack of Transportation (Non-Medical): Not on file  Physical Activity:   . Days of Exercise per Week: Not on file  . Minutes of Exercise per Session: Not on file  Stress:   . Feeling of Stress : Not on file  Social Connections:   . Frequency of Communication with Friends and Family: Not  on file  . Frequency of Social Gatherings with Friends and Family: Not on file  . Attends Religious Services: Not on file  . Active Member of Clubs or Organizations: Not on file  . Attends Banker Meetings: Not on file  . Marital Status: Not on file   Family History: History reviewed. No pertinent family history. Allergies: No Known Allergies Medications: See med rec.  Review of Systems:See HPI for pertinent positives and negatives.   Depression screen [facility] 2/9 05/12/2020 03/29/2019 10/16/2018 08/24/2017  Decreased Interest 1 2 1 1   Down, Depressed, Hopeless 1 2 0 0  PHQ - 2 Score 2 4 1 1   Altered sleeping 2 3 1 2   Tired, decreased energy 2 3 1 1   Change in appetite 1 1 0 2  Feeling bad or failure about yourself  1 1 1  0  Trouble concentrating 2 0 0 0  Moving slowly or fidgety/restless 0 0 0 0  Suicidal thoughts 0 0 0 0  PHQ-9 Score 10 12 4 6   Difficult doing work/chores Very difficult Somewhat difficult Somewhat difficult -   GAD 7 : Generalized Anxiety Score 05/12/2020 03/29/2019 10/16/2018 08/24/2017  Nervous, Anxious, on Edge 1 2 0 0  Control/stop worrying 0 1 0 0  Worry too much - different things 0 2 0 0  Trouble relaxing 2 1 0 1  Restless 1 1 0 0  Easily annoyed or irritable 2 2 0 1  Afraid - awful might happen 0 1 0 0  Total GAD 7 Score 6 10 0 2  Anxiety Difficulty Somewhat difficult Somewhat difficult Not difficult at all -   Objective:    General: Well Developed, well nourished, and in no acute distress.  Neuro: Alert and oriented x3.  HEENT: Normocephalic, atraumatic.  Skin: Warm and dry. Cardiac: Regular rate and rhythm, no murmurs rubs or gallops, no lower extremity edema.  Respiratory: Clear to auscultation bilaterally. Not using accessory muscles, speaking in full sentences.   Impression and Recommendations:    1. Acute stress reaction Continue Sertraline 150mg  daily. - sertraline (ZOLOFT) 100 MG tablet; Take 1.5 tablets (150 mg total) by mouth  daily.  Dispense: 135 tablet; Refill: 3  2. Hyperlipidemia, unspecified hyperlipidemia type Continue Atorvastatin 10mg  daily. Checking lipid panel and CMP. - atorvastatin (LIPITOR) 10 MG tablet; Take 1 tablet (10 mg total) by mouth daily.  Dispense: 90 tablet; Refill: 3 - COMPLETE METABOLIC PANEL WITH GFR - Lipid panel  3. Preventative health care Checking CBC, CMP, and Lipid panel. Overdue for annual physical exam. Drawing preventative labs with lipid panel for patient convenience. No preventative care provided during this visit.  - CBC - COMPLETE METABOLIC PANEL WITH GFR - Lipid panel  4. Inattention Advised patient that insurance coverage for ADHD medications often requires an actual diagnosis in the medical record. Discussed treatment options for inattention. With her concern for emotional dampening, recommend trialing Wellbutrin  XL 150mg  daily. This may help with focus as well as depression. If patient finds her records, we can upload them into the chart then look into other options if Wellbutrin is not helpful. If she is unable to find her records, we will need to refer to psychology for further testing and diagnosis before ordering treatment.   Return in about 4 weeks (around 06/09/2020) for mood follow up (can be virtual). ___________________________________________ 06/11/2020, DNP, APRN, FNP-BC Primary Care and Sports Medicine [facility] [place]

## 2020-09-21 ENCOUNTER — Other Ambulatory Visit: Payer: Self-pay

## 2020-09-21 ENCOUNTER — Encounter: Payer: Self-pay | Admitting: Emergency Medicine

## 2020-09-21 ENCOUNTER — Emergency Department
Admission: EM | Admit: 2020-09-21 | Discharge: 2020-09-21 | Disposition: A | Discharge status: Home / Self Care | Payer: Managed Care, Other (non HMO) | Source: Home / Self Care

## 2020-09-21 DIAGNOSIS — J040 Acute laryngitis: Secondary | ICD-10-CM

## 2020-09-21 DIAGNOSIS — J0100 Acute maxillary sinusitis, unspecified: Secondary | ICD-10-CM | POA: Diagnosis not present

## 2020-09-21 DIAGNOSIS — J069 Acute upper respiratory infection, unspecified: Secondary | ICD-10-CM

## 2020-09-21 MED ORDER — AMOXICILLIN-POT CLAVULANATE 875-125 MG PO TABS: 1.0000 | ORAL_TABLET | Freq: Two times a day (BID) | ORAL | 0 refills | Status: DC

## 2020-09-21 NOTE — ED Provider Notes (Signed)
[facility]    CSN: [id number] Arrival date & time: 09/21/20  0807      History   Chief Complaint Chief Complaint  Patient presents with  . Laryngitis    HPI Kristina Harrington is a 40 y.o. female.   HPI Kristina Harrington is a 40 y.o. female presenting to UC with c/o right sided headache that started 1 week ago, intermittent throughout the week, then sudden worsening of symptoms with sinus congestion, facial pressure, body aches, hoarse voice, chills, and low grade fever since yesterday.  She has taken OTC Mucinex Maxrelief, nasal sprays, netipot, clarinex and singulair without relief. She has had COVID vaccine in March 2021. No known sick contacts this week. She took an at-home COVID test two days ago, it was negative.    Past Medical History:  Diagnosis Date  . Allergic rhinitis 12/11/2014   Dymista, singulair, claritin.    [name] Hyperlipidemia 08/02/2016  . Lumbar degenerative disc disease 09/20/2013    Patient Active Problem List   Diagnosis Date Noted  . Acute stress reaction 08/02/2016  . Hyperlipidemia 08/02/2016  . Rupture of anterior cruciate ligament of left knee 03/28/2015  . Allergic rhinitis 12/11/2014  . Iliotibial band syndrome of right side 11/02/2013  . Lumbar degenerative disc disease 09/20/2013    Past Surgical History:  Procedure Laterality Date  . ARTHROSCOPIC REPAIR ACL  03/2015  . KNEE SURGERY Left    ACL    OB History   No obstetric history on file.      Home Medications    Prior to Admission medications   Medication Sig Start Date End Date Taking? Authorizing Provider  amoxicillin-clavulanate (AUGMENTIN) 875-125 MG tablet Take 1 tablet by mouth 2 (two) times daily. One po bid x 7 days 09/21/20  Yes Armonie Staten O, PA-C  atorvastatin (LIPITOR) 10 MG tablet Take 1 tablet (10 mg total) by mouth daily. 05/12/20  Yes Christen Butter, NP  buPROPion (WELLBUTRIN XL) 150 MG 24 hr tablet Take 1 tablet (150 mg total) by mouth every morning. 05/12/20   Yes Christen Butter, NP  CAMRESE 0.15-0.03 &0.01 MG tablet  05/24/16  Yes [provider]  loratadine (CLARITIN) 10 MG tablet Take 10 mg by mouth daily.   Yes [provider]  sertraline (ZOLOFT) 100 MG tablet Take 1.5 tablets (150 mg total) by mouth daily. 05/12/20  Yes Christen Butter, NP    Family History No family history on file.  Social History Social History   Tobacco Use  . Smoking status: Never Smoker  . Smokeless tobacco: Never Used  Vaping Use  . Vaping Use: Never used  Substance Use Topics  . Alcohol use: Yes    Comment: 1-2 drinks a year  . Drug use: No     Allergies   Patient has no known allergies.   Review of Systems Review of Systems  Constitutional: Positive for chills and fever.  HENT: Positive for congestion, sinus pressure and sore throat. Negative for ear pain, trouble swallowing and voice change.   Respiratory: Positive for cough. Negative for shortness of breath.   Cardiovascular: Negative for chest pain and palpitations.  Gastrointestinal: Negative for abdominal pain, diarrhea, nausea and vomiting.  Musculoskeletal: Positive for arthralgias and myalgias. Negative for back pain.  Skin: Negative for rash.  Neurological: Positive for headaches. Negative for dizziness and light-headedness.  All other systems reviewed and are negative.    Physical Exam Triage Vital Signs ED Triage Vitals [09/21/20 0824]  Enc Vitals Group  BP 136/86     Pulse Rate (!) 106     Resp      Temp 98.9 F (37.2 C)     Temp Source Oral     SpO2 97 %     Weight      Height      Head Circumference      Peak Flow      Pain Score 8     Pain Loc      Pain Edu?      Excl. in GC?    No data found.  Updated Vital Signs BP 136/86 (BP Location: Left Arm)   Pulse (!) 106   Temp 98.9 F (37.2 C) (Oral)   LMP 07/22/2020   SpO2 97%   Visual Acuity Right Eye Distance:   Left Eye Distance:   Bilateral Distance:    Right Eye Near:   Left Eye Near:     Bilateral Near:     Physical Exam Vitals and nursing note reviewed.  Constitutional:      General: She is not in acute distress.    Appearance: Normal appearance. She is well-developed and well-nourished. She is not ill-appearing, toxic-appearing or diaphoretic.  HENT:     Head: Normocephalic and atraumatic.     Right Ear: Tympanic membrane and ear canal normal.     Left Ear: Tympanic membrane and ear canal normal.     Nose: Congestion present.     Right Sinus: Maxillary sinus tenderness present. No frontal sinus tenderness.     Left Sinus: Maxillary sinus tenderness present. No frontal sinus tenderness.     Mouth/Throat:     Lips: Pink.     Mouth: Mucous membranes are moist.     Pharynx: Oropharynx is clear. Uvula midline. No pharyngeal swelling, oropharyngeal exudate, posterior oropharyngeal erythema or uvula swelling.  Eyes:     Extraocular Movements: EOM normal.  Cardiovascular:     Rate and Rhythm: Normal rate and regular rhythm.  Pulmonary:     Effort: Pulmonary effort is normal. No respiratory distress.     Breath sounds: Normal breath sounds. No stridor. No wheezing, rhonchi or rales.     Comments: Hoarse voice but no stridor. Musculoskeletal:        General: Normal range of motion.     Cervical back: Normal range of motion and neck supple.  Lymphadenopathy:     Cervical: No cervical adenopathy.  Skin:    General: Skin is warm and dry.  Neurological:     Mental Status: She is alert and oriented to person, place, and time.  Psychiatric:        Mood and Affect: Mood and affect normal.        Behavior: Behavior normal.      UC Treatments / Results  Labs (all labs ordered are listed, but only abnormal results are displayed) Labs Reviewed  NOVEL CORONAVIRUS, NAA    EKG   Radiology No results found.  Procedures Procedures (including critical care time)  Medications Ordered in UC Medications - No data to display  Initial Impression / Assessment and Plan  / UC Course  I have reviewed the triage vital signs and the nursing notes.  Pertinent labs & imaging results that were available during my care of the patient were reviewed by me and considered in my medical decision making (see chart for details).     One week of worsening symptoms Hx of sinus infections, maxillary sinus tenderness on exam Will tx  for bacterial infection Rx: augmentin COVID PCR test pending AVS and work note provided  Final Clinical Impressions(s) / UC Diagnoses   Final diagnoses:  Upper respiratory infection with cough and congestion  Laryngitis  Acute non-recurrent maxillary sinusitis     Discharge Instructions      Please take antibiotics as prescribed and be sure to complete entire course even if you start to feel better to ensure infection does not come back.  You may take 500mg  acetaminophen every 4-6 hours or in combination with ibuprofen 400-600mg  every 6-8 hours as needed for pain, inflammation, and fever.  Be sure to well hydrated with clear liquids and get at least 8 hours of sleep at night, preferably more while sick.   Please follow up with family medicine in 1 week if needed.     ED Prescriptions    Medication Sig Dispense Auth. Provider   amoxicillin-clavulanate (AUGMENTIN) 875-125 MG tablet Take 1 tablet by mouth 2 (two) times daily. One po bid x 7 days 14 tablet , Lurene Shadow     PDMP not reviewed this encounter.   [place], Lurene Shadow 09/21/20 [phone]

## 2020-09-21 NOTE — Discharge Instructions (Signed)

## 2020-09-21 NOTE — ED Triage Notes (Signed)
Patient c/o headache this week, laryngitis x 1 day, productive cough, feels awful, low grade fever, body aches and chills.  Taking Mucinex Maxrelief, Nasal Spray, Nettipot, Clarinex and Singulair.  Patient is vaccinated.

## 2020-09-22 LAB — SARS-COV-2 RNA,(COVID-19) QUALITATIVE NAAT: SARS CoV2 RNA: DETECTED — AB

## 2020-11-06 ENCOUNTER — Telehealth (INDEPENDENT_AMBULATORY_CARE_PROVIDER_SITE_OTHER): Payer: Managed Care, Other (non HMO) | Admitting: Family Medicine

## 2020-11-06 ENCOUNTER — Encounter: Payer: Self-pay | Admitting: Family Medicine

## 2020-11-06 DIAGNOSIS — F909 Attention-deficit hyperactivity disorder, unspecified type: Secondary | ICD-10-CM

## 2020-11-06 DIAGNOSIS — J0100 Acute maxillary sinusitis, unspecified: Secondary | ICD-10-CM | POA: Diagnosis not present

## 2020-11-06 MED ORDER — AMOXICILLIN-POT CLAVULANATE 875-125 MG PO TABS: 1.0000 | ORAL_TABLET | Freq: Two times a day (BID) | ORAL | 0 refills | Status: DC

## 2020-11-06 MED ORDER — AMPHETAMINE-DEXTROAMPHET ER 10 MG PO CP24: 10.0000 mg | ORAL_CAPSULE | Freq: Every day | ORAL | 0 refills | Status: DC

## 2020-11-06 NOTE — Progress Notes (Signed)
sxs began 3 days ago. She stated that her sinus pain/pressure on R side, sinus drainage, (yellow/green)  She has been using a Nasal spray, netti pot.   Denies any f/s/c/n/v/d  Took home covid test last night this was negative. No sick contacts.  Pt wanted to know if she could do a time released Wellbutrin she stated that it is wearing off by the middle of the day. She asked if it was a "time released" or "long acting" that would help her with this?

## 2020-11-06 NOTE — Assessment & Plan Note (Signed)
He was tested as a child and was on several medication she had an IEP through school which she still has a copy of.  She took medication even into college.  With Adderall she remembers feeling more down and sad.  She did try methylphenidate at 1 point but says she does not remember why it was stopped.  Even took Strattera at one point it sounds like.  She would like to consider a trial of medication again she just noted she is having a harder time focusing at work.  We discussed options and potential side effects.  She would like to try a low-dose of Adderall again.  Encouraged her to stop immediately if she starts feeling down or depressed.

## 2020-11-06 NOTE — Progress Notes (Signed)
Virtual Visit via Video Note  I connected with Kristina Harrington on 11/06/20 at  2:40 PM EST by a video enabled telemedicine application and verified that I am speaking with the correct person using two identifiers.   I discussed the limitations of evaluation and management by telemedicine and the availability of in person appointments. The patient expressed understanding and agreed to proceed.  Patient location: at home  Provider location: in office  Subjective:    CC: Sinus sxs  HPI: sxs began 3 days ago. She stated that her sinus pain/pressure on R side of face, sinus drainage, (yellow/green). She was having pain and pressure even before the congestions started.    She has been using a Nasal spray, netti pot. No cough.Denies any f/s/c/n/v/.  Took home covid test last night this was negative. No sick contacts.  Pt wanted to know if she could do a time released Wellbutrin she stated that it is wearing off by the middle of the day. She asked if it was a "time released" or "long acting" that would help her with this?  She had COVID in early Jan.   Past medical history, Surgical history, Family history not pertinant except as noted below, Social history, Allergies, and medications have been entered into the medical record, reviewed, and corrections made.   Review of Systems: No fevers, chills, night sweats, weight loss, chest pain, or shortness of breath.   Objective:    General: Speaking clearly in complete sentences without any shortness of breath.  Alert and oriented x3.  Normal judgment. No apparent acute distress.    Impression and Recommendations:    ADHD He was tested as a child and was on several medication she had an IEP through school which she still has a copy of.  She took medication even into college.  With Adderall she remembers feeling more down and sad.  She did try methylphenidate at 1 point but says she does not remember why it was stopped.  Even took Strattera at  one point it sounds like.  She would like to consider a trial of medication again she just noted she is having a harder time focusing at work.  We discussed options and potential side effects.  She would like to try a low-dose of Adderall again.  Encouraged her to stop immediately if she starts feeling down or depressed.  Acute maxillary sinusitis - will tx with Augmentin.  Call if not better in 1 week.  Okay to continue with symptomatic care.  Consider it could still be viral.    Time spent in encounter 30 minutes  I discussed the assessment and treatment plan with the patient. The patient was provided an opportunity to ask questions and all were answered. The patient agreed with the plan and demonstrated an understanding of the instructions.   The patient was advised to call back or seek an in-person evaluation if the symptoms worsen or if the condition fails to improve as anticipated.   [name], MD

## 2020-11-12 ENCOUNTER — Telehealth: Payer: Self-pay | Admitting: *Deleted

## 2020-11-12 NOTE — Telephone Encounter (Signed)
PA started for Adderall 10mg  through Cover [employer].

## 2020-11-12 NOTE — Telephone Encounter (Signed)
Adderall approved. Pharmacy notified.  

## 2020-11-25 ENCOUNTER — Encounter: Payer: Self-pay | Admitting: Family Medicine

## 2020-12-16 DIAGNOSIS — J3089 Other allergic rhinitis: Secondary | ICD-10-CM | POA: Diagnosis not present

## 2020-12-16 DIAGNOSIS — J342 Deviated nasal septum: Secondary | ICD-10-CM | POA: Diagnosis not present

## 2020-12-24 ENCOUNTER — Encounter: Payer: Self-pay | Admitting: Family Medicine

## 2020-12-24 DIAGNOSIS — F909 Attention-deficit hyperactivity disorder, unspecified type: Secondary | ICD-10-CM

## 2020-12-24 MED ORDER — AMPHETAMINE-DEXTROAMPHET ER 10 MG PO CP24: 10.0000 mg | ORAL_CAPSULE | Freq: Every day | ORAL | 0 refills | Status: DC

## 2021-01-13 DIAGNOSIS — J329 Chronic sinusitis, unspecified: Secondary | ICD-10-CM | POA: Diagnosis not present

## 2021-01-13 DIAGNOSIS — J3089 Other allergic rhinitis: Secondary | ICD-10-CM | POA: Diagnosis not present

## 2021-01-15 ENCOUNTER — Encounter: Payer: Self-pay | Admitting: Family Medicine

## 2021-01-15 DIAGNOSIS — F909 Attention-deficit hyperactivity disorder, unspecified type: Secondary | ICD-10-CM

## 2021-01-16 MED ORDER — AMPHETAMINE-DEXTROAMPHET ER 15 MG PO CP24: 15.0000 mg | ORAL_CAPSULE | Freq: Every day | ORAL | 0 refills | Status: DC

## 2021-01-16 NOTE — Telephone Encounter (Signed)
Med changed and sent for 30 days. She will need f/u for the next RF and needs BP check etc. Will need to be in person.

## 2021-01-28 DIAGNOSIS — J301 Allergic rhinitis due to pollen: Secondary | ICD-10-CM | POA: Diagnosis not present

## 2021-02-12 ENCOUNTER — Encounter: Payer: Self-pay | Admitting: Family Medicine

## 2021-02-12 NOTE — Telephone Encounter (Signed)
Left voicemail for patient to call back to get appointment scheduled. AM 

## 2021-02-17 ENCOUNTER — Telehealth (INDEPENDENT_AMBULATORY_CARE_PROVIDER_SITE_OTHER): Payer: BC Managed Care – PPO | Admitting: Family Medicine

## 2021-02-17 ENCOUNTER — Encounter: Payer: Self-pay | Admitting: Family Medicine

## 2021-02-17 DIAGNOSIS — F909 Attention-deficit hyperactivity disorder, unspecified type: Secondary | ICD-10-CM

## 2021-02-17 DIAGNOSIS — R748 Abnormal levels of other serum enzymes: Secondary | ICD-10-CM

## 2021-02-17 DIAGNOSIS — E781 Pure hyperglyceridemia: Secondary | ICD-10-CM

## 2021-02-17 MED ORDER — AMPHETAMINE-DEXTROAMPHET ER 15 MG PO CP24: 15.0000 mg | ORAL_CAPSULE | ORAL | 0 refills | Status: DC

## 2021-02-17 MED ORDER — AMPHETAMINE-DEXTROAMPHET ER 15 MG PO CP24: 15.0000 mg | ORAL_CAPSULE | Freq: Every day | ORAL | 0 refills | Status: DC

## 2021-02-17 MED ORDER — AMPHETAMINE-DEXTROAMPHET ER 20 MG PO CP24: 20.0000 mg | ORAL_CAPSULE | Freq: Every day | ORAL | 0 refills | Status: DC

## 2021-02-17 NOTE — Progress Notes (Signed)
Pt would like to discuss increasing her dosage for the upcoming year to help her with focusing

## 2021-02-17 NOTE — Progress Notes (Signed)
Virtual Visit via Video Note  I connected with Kristina Harrington on 02/17/21 at  3:40 PM EDT by a video enabled telemedicine application and verified that I am speaking with the correct person using two identifiers.   I discussed the limitations of evaluation and management by telemedicine and the availability of in person appointments. The patient expressed understanding and agreed to proceed.  Patient location: at home Provider location: in office  Subjective:    CC: ADHD   HPI: F/U ADHD - starting in August she would like to consider increasing her Adderall to 20mg . She will having more responsibilities at work this next year.  She said she was actually back on 25 mg back in college.  She feels like she is been struggling a little bit more with the tension over the last couple of months.  Daughter with stomach pain and fever.  No vomiting or diarrhea. She hasn't had any symptoms.     Past medical history, Surgical history, Family history not pertinant except as noted below, Social history, Allergies, and medications have been entered into the medical record, reviewed, and corrections made.    Objective:    General: Speaking clearly in complete sentences without any shortness of breath.  Alert and oriented x3.  Normal judgment. No apparent acute distress.    Impression and Recommendations:    ADHD Discussed options.  Ok to increase Adderall around August.  So will refill med x 2 months and then inc to 20mg  for August. F/U in 3 mo. Call if any S.E.  Monitor BP.   Is also overdue for screening labs in fact we had ordered them last summer but she never had a chance to get back over here.  She says she thinks she can come in the next couple weeks so went ahead and placed a new order today to at least get a screening lipid.  She does have a history of elevated triglycerides.  Also check a CMP and a CBC as well.  Orders Placed This Encounter  Procedures  . CBC  . COMPLETE METABOLIC  PANEL WITH GFR  . Lipid panel    Order Specific Question:   Has the patient fasted?    Answer:   Yes    Meds ordered this encounter  Medications  . amphetamine-dextroamphetamine (ADDERALL XR) 15 MG 24 hr capsule    Sig: Take 1 capsule by mouth daily.    Dispense:  30 capsule    Refill:  0  . amphetamine-dextroamphetamine (ADDERALL XR) 15 MG 24 hr capsule    Sig: Take 1 capsule by mouth every morning.    Dispense:  30 capsule    Refill:  0  . amphetamine-dextroamphetamine (ADDERALL XR) 20 MG 24 hr capsule    Sig: Take 1 capsule (20 mg total) by mouth daily.    Dispense:  30 capsule    Refill:  0     I discussed the assessment and treatment plan with the patient. The patient was provided an opportunity to ask questions and all were answered. The patient agreed with the plan and demonstrated an understanding of the instructions.   The patient was advised to call back or seek an in-person evaluation if the symptoms worsen or if the condition fails to improve as anticipated.   , MD

## 2021-02-17 NOTE — Assessment & Plan Note (Addendum)
Discussed options.  Ok to increase Adderall around August.  So will refill med x 2 months and then inc to 20mg  for August. F/U in 3 mo. Call if any S.E.  Monitor BP.

## 2021-02-23 ENCOUNTER — Encounter: Payer: Self-pay | Admitting: Family Medicine

## 2021-02-23 MED ORDER — AMPHETAMINE-DEXTROAMPHET ER 15 MG PO CP24: 15.0000 mg | ORAL_CAPSULE | ORAL | 0 refills | Status: DC

## 2021-02-23 NOTE — Telephone Encounter (Signed)
Meds ordered this encounter  Medications   amphetamine-dextroamphetamine (ADDERALL XR) 15 MG 24 hr capsule    Sig: Take 1 capsule by mouth every morning.    Dispense:  30 capsule    Refill:  0    

## 2021-03-06 ENCOUNTER — Telehealth (INDEPENDENT_AMBULATORY_CARE_PROVIDER_SITE_OTHER): Payer: BC Managed Care – PPO | Admitting: Family Medicine

## 2021-03-06 ENCOUNTER — Encounter: Payer: Self-pay | Admitting: Family Medicine

## 2021-03-06 DIAGNOSIS — J329 Chronic sinusitis, unspecified: Secondary | ICD-10-CM

## 2021-03-06 DIAGNOSIS — J40 Bronchitis, not specified as acute or chronic: Secondary | ICD-10-CM | POA: Diagnosis not present

## 2021-03-06 MED ORDER — AMOXICILLIN-POT CLAVULANATE 875-125 MG PO TABS: 1.0000 | ORAL_TABLET | Freq: Two times a day (BID) | ORAL | 0 refills | Status: AC

## 2021-03-06 MED ORDER — GUAIFENESIN ER 600 MG PO TB12: 1200.0000 mg | ORAL_TABLET | Freq: Two times a day (BID) | ORAL | 2 refills | Status: DC

## 2021-03-06 NOTE — Progress Notes (Signed)
Virtual Video Visit via MyChart Note  I connected with  Kristina Harrington on 03/06/21 at  9:10 AM EDT by the video enabled telemedicine application for MyChart, and verified that I am speaking with the correct person using two identifiers.   I introduced myself as a Publishing rights manager with the practice. We discussed the limitations of evaluation and management by telemedicine and the availability of in person appointments. The patient expressed understanding and agreed to proceed.  Participating parties in this visit include: The patient and the nurse practitioner listed.  The patient is: At home I am: In the office - Primary [name]  Subjective:    CC:  Chief Complaint  Patient presents with   Sinusitis   Cough    HPI: Kristina Harrington is a 40 y.o. year old female presenting today via MyChart today for sinusitis and cough.  Patient has struggled with her allergies for the past several months, but at the end of last week she started to notice she was feeling worse and more tired. By Sunday she started to have some ear pressure/pain, sinus pressure, that seemed to get better for a day or two. However, yesterday patient's symptoms became significantly worse, sore throat, bilateral ear pain, significant nasal congestion with purulent mucus, productive cough with green sputum, slight chest soreness from coughing, maxillary sinus pressure 7/10 slightly worse on the right. She has not had any fevers, trouble breathing, wheezing, GI/GU symptoms, loss of taste and smell. She has had a negative COVID test. She has not had much improvement with Neti pot, Mucinex, Xyzal.     Past medical history, Surgical history, Family history not pertinant except as noted below, Social history, Allergies, and medications have been entered into the medical record, reviewed, and corrections made.   Review of Systems:  All review of systems negative except what is listed in the HPI   Objective:     General:  Speaking clearly in complete sentences. Absent shortness of breath noted.   Alert and oriented x3.   Normal judgment.  Absent acute distress.   Impression and Recommendations:    1. Sinobronchitis Given severity of symptoms going into the weekend, will go ahead and treat with ABX. Also sending in Mucinex. Encouraged OTC cough/analgesics, rest, hydration, warm compresses, humidifier use. Patient aware of signs/symptoms requiring further/urgent evaluation.   - amoxicillin-clavulanate (AUGMENTIN) 875-125 MG tablet; Take 1 tablet by mouth 2 (two) times daily for 7 days.  Dispense: 14 tablet; Refill: 0 - guaiFENesin (MUCINEX) 600 MG 12 hr tablet; Take 2 tablets (1,200 mg total) by mouth 2 (two) times daily.  Dispense: 30 tablet; Refill: 2    Follow-up if symptoms worsen or fail to improve.    I discussed the assessment and treatment plan with the patient. The patient was provided an opportunity to ask questions and all were answered. The patient agreed with the plan and demonstrated an understanding of the instructions.   The patient was advised to call back or seek an in-person evaluation if the symptoms worsen or if the condition fails to improve as anticipated.  I provided 20 minutes of non-face-to-face interaction with this MYCHART visit including intake, same-day documentation, and chart review.   Clayborne Dana, NP

## 2021-03-16 ENCOUNTER — Emergency Department
Admission: EM | Admit: 2021-03-16 | Discharge: 2021-03-16 | Disposition: A | Discharge status: Home / Self Care | Payer: BC Managed Care – PPO | Source: Home / Self Care | Attending: Family Medicine | Admitting: Family Medicine

## 2021-03-16 ENCOUNTER — Other Ambulatory Visit: Payer: Self-pay

## 2021-03-16 ENCOUNTER — Emergency Department (INDEPENDENT_AMBULATORY_CARE_PROVIDER_SITE_OTHER): Payer: BC Managed Care – PPO

## 2021-03-16 DIAGNOSIS — J329 Chronic sinusitis, unspecified: Secondary | ICD-10-CM | POA: Diagnosis not present

## 2021-03-16 DIAGNOSIS — R5383 Other fatigue: Secondary | ICD-10-CM | POA: Diagnosis not present

## 2021-03-16 DIAGNOSIS — R059 Cough, unspecified: Secondary | ICD-10-CM | POA: Diagnosis not present

## 2021-03-16 HISTORY — DX: Attention-deficit hyperactivity disorder, unspecified type: F90.9

## 2021-03-16 MED ORDER — PREDNISONE 20 MG PO TABS: 20.0000 mg | ORAL_TABLET | Freq: Two times a day (BID) | ORAL | 0 refills | Status: DC

## 2021-03-16 MED ORDER — SULFAMETHOXAZOLE-TRIMETHOPRIM 800-160 MG PO TABS: 1.0000 | ORAL_TABLET | Freq: Two times a day (BID) | ORAL | 0 refills | Status: AC

## 2021-03-16 NOTE — ED Triage Notes (Addendum)
Pt presents to Urgent Care with c/o cough, fatigue, nasal congestion, and R otalgia x 4 days. Reports having sinus infection which began on 03/01/21 and finished course of Amoxicillin for this. She states she was improving from this but after cleaning "a musty room" 4 days ago, the above symptoms began. Cannot taste or smell. Reports negative home COVID test 2 days ago; pt vaccinated.

## 2021-03-16 NOTE — ED Provider Notes (Signed)
[facility]    CSN: [id number] Arrival date & time: 03/16/21  7035      History   Chief Complaint Chief Complaint  Patient presents with   Cough   Fatigue   Otalgia    Right    HPI Kristina Harrington is a 40 y.o. female.   HPI  Patient has a chronic sinus condition.  She has an ear nose and throat doctor that she sees regularly.  She has been 3 years of allergy shots.  In spite of that she still gets recurring sinus infections.  She was just treated for 1 on 03/01/2021.  She was given a week of Augmentin.  It seems every time she has a sinus infection she is given a week of Augmentin.  She states usually this helps her.  Right now she states that it helped very little and the infection now is worse.  She has significant pressure in her face, which is worse if she tries to lean over.  She has thick mucus that she describes as "chunky".  She is coughing.  She has a productive cough.  Even though she has an ENT appointment tomorrow her family encouraged her to come in today because she "sounds like she has pneumonia".  She is not a smoker.  She does not usually have lung disease   Past Medical History:  Diagnosis Date   ADHD    Allergic rhinitis 12/11/2014   Dymista, singulair, claritin.     Hyperlipidemia 08/02/2016   Lumbar degenerative disc disease 09/20/2013    Patient Active Problem List   Diagnosis Date Noted   ADHD 11/06/2020   Acute stress reaction 08/02/2016   Hyperlipidemia 08/02/2016   Rupture of anterior cruciate ligament of left knee 03/28/2015   Allergic rhinitis 12/11/2014   Iliotibial band syndrome of right side 11/02/2013   Lumbar degenerative disc disease 09/20/2013    Past Surgical History:  Procedure Laterality Date   ARTHROSCOPIC REPAIR ACL  03/2015   KNEE SURGERY Left    ACL    OB History   No obstetric history on file.      Home Medications    Prior to Admission medications   Medication Sig Start Date End Date Taking? Authorizing  Provider  predniSONE (DELTASONE) 20 MG tablet Take 1 tablet (20 mg total) by mouth 2 (two) times daily with a meal. 03/16/21  Yes Eustace Moore, MD  sulfamethoxazole-trimethoprim (BACTRIM DS) 800-160 MG tablet Take 1 tablet by mouth 2 (two) times daily for 14 days. 03/16/21 03/30/21 Yes Eustace Moore, MD  amphetamine-dextroamphetamine (ADDERALL XR) 15 MG 24 hr capsule Take 1 capsule by mouth daily. 03/18/21   Agapito Games, MD  amphetamine-dextroamphetamine (ADDERALL XR) 15 MG 24 hr capsule Take 1 capsule by mouth every morning. 02/23/21   Agapito Games, MD  amphetamine-dextroamphetamine (ADDERALL XR) 20 MG 24 hr capsule Take 1 capsule (20 mg total) by mouth daily. 04/17/21   Agapito Games, MD  atorvastatin (LIPITOR) 10 MG tablet Take 1 tablet (10 mg total) by mouth daily. 05/12/20   Christen Butter, NP  CAMRESE 0.15-0.03 &0.01 MG tablet  05/24/16   [provider]  fluticasone (FLONASE) 50 MCG/ACT nasal spray Place 2 sprays into both nostrils daily. 01/19/21   [provider]  guaiFENesin (MUCINEX) 600 MG 12 hr tablet Take 2 tablets (1,200 mg total) by mouth 2 (two) times daily. 03/06/21   Clayborne Dana, NP  levocetirizine (XYZAL) 5 MG tablet Take 5  mg by mouth every evening.    [provider]  sertraline (ZOLOFT) 100 MG tablet Take 1.5 tablets (150 mg total) by mouth daily. 05/12/20   Christen Butter, NP    Family History Family History  Problem Relation Age of Onset   Healthy Mother    Healthy Father     Social History Social History   Tobacco Use   Smoking status: Never   Smokeless tobacco: Never  Vaping Use   Vaping Use: Never used  Substance Use Topics   Alcohol use: Not Currently   Drug use: No     Allergies   Patient has no known allergies.   Review of Systems Review of Systems See HPI  Physical Exam Triage Vital Signs ED Triage Vitals  Enc Vitals Group     BP 03/16/21 1044 124/87     Pulse Rate 03/16/21 1044 (!) 102      Resp 03/16/21 1044 20     Temp 03/16/21 1044 98.3 F (36.8 C)     Temp Source 03/16/21 1044 Oral     SpO2 03/16/21 1044 97 %     Weight 03/16/21 1039 231 lb (104.8 kg)     Height 03/16/21 1039 5\' 9"  (1.753 m)     Head Circumference --      Peak Flow --      Pain Score 03/16/21 1039 9     Pain Loc --      Pain Edu? --      Excl. in GC? --    No data found.  Updated Vital Signs BP 124/87 (BP Location: Right Arm)   Pulse (!) 102   Temp 98.3 F (36.8 C) (Oral)   Resp 20   Ht 5\' 9"  (1.753 m)   Wt 104.8 kg   LMP 02/15/2021 (Approximate)   SpO2 97%   BMI 34.11 kg/m     Physical Exam Constitutional:      General: She is not in acute distress.    Appearance: She is well-developed. She is not ill-appearing.     Comments: Appears tired  HENT:     Head: Normocephalic and atraumatic.     Right Ear: Tympanic membrane and ear canal normal.     Left Ear: Tympanic membrane and ear canal normal.     Nose: Congestion present.     Mouth/Throat:     Pharynx: Posterior oropharyngeal erythema present.     Comments: Tenderness of maxillary ethmoid and frontal sinuses Eyes:     Conjunctiva/sclera: Conjunctivae normal.     Pupils: Pupils are equal, round, and reactive to light.  Cardiovascular:     Rate and Rhythm: Normal rate and regular rhythm.     Heart sounds: Normal heart sounds.  Pulmonary:     Effort: Pulmonary effort is normal. No respiratory distress.     Breath sounds: Rhonchi present.  Abdominal:     General: There is no distension.     Palpations: Abdomen is soft.  Musculoskeletal:        General: Normal range of motion.     Cervical back: Normal range of motion.  Lymphadenopathy:     Cervical: Cervical adenopathy present.  Skin:    General: Skin is warm and dry.  Neurological:     Mental Status: She is alert.  Psychiatric:        Mood and Affect: Mood normal.     UC Treatments / Results  Labs (all labs ordered are listed, but only abnormal results are  displayed) Labs Reviewed - No data to display  EKG   Radiology DG Chest 2 View  Result Date: 03/16/2021 CLINICAL DATA:  Cough and fatigue.  Sinus infection EXAM: CHEST - 2 VIEW COMPARISON:  None. FINDINGS: Normal mediastinum and cardiac silhouette. Normal pulmonary vasculature. No evidence of effusion, infiltrate, or pneumothorax. No acute bony abnormality. IMPRESSION: No acute cardiopulmonary process. Electronically Signed   By: Genevive Bi M.D.   On: 03/16/2021 11:32    Procedures Procedures (including critical care time)  Medications Ordered in UC Medications - No data to display  Initial Impression / Assessment and Plan / UC Course  I have reviewed the triage vital signs and the nursing notes.  Pertinent labs & imaging results that were available during my care of the patient were reviewed by me and considered in my medical decision making (see chart for details).     Patient needs to go back to her ENT tomorrow to determine whether she has adequate sinus drainage.  In the meantime we discussed Flonase, nasal rinses, increasing oral fluids, Mucinex as measures to use at home.  In addition I am adding prednisone 20 a day for 5 days and Septra DS for 2 weeks.  Follow-up with primary care and ENT Final Clinical Impressions(s) / UC Diagnoses   Final diagnoses:  Recurrent sinus infections     Discharge Instructions      Continue Flonase twice a day during the infection then go back down to once a day Continue Mucinex Drink lots of water Take the antibiotic 2 times a day as directed Take the prednisone 2 times a day as directed.  Take 2 doses today See your ENT as scheduled   ED Prescriptions     Medication Sig Dispense Auth. Provider   predniSONE (DELTASONE) 20 MG tablet Take 1 tablet (20 mg total) by mouth 2 (two) times daily with a meal. 10 tablet Eustace Moore, MD   sulfamethoxazole-trimethoprim (BACTRIM DS) 800-160 MG tablet Take 1 tablet by mouth 2 (two)  times daily for 14 days. 28 tablet Eustace Moore, MD      PDMP not reviewed this encounter.   Eustace Moore, MD 03/16/21 [phone]

## 2021-03-16 NOTE — Discharge Instructions (Addendum)
Continue Flonase twice a day during the infection then go back down to once a day Continue Mucinex Drink lots of water Take the antibiotic 2 times a day as directed Take the prednisone 2 times a day as directed.  Take 2 doses today See your ENT as scheduled

## 2021-03-17 DIAGNOSIS — J3089 Other allergic rhinitis: Secondary | ICD-10-CM | POA: Diagnosis not present

## 2021-03-17 DIAGNOSIS — J0191 Acute recurrent sinusitis, unspecified: Secondary | ICD-10-CM | POA: Diagnosis not present

## 2021-04-16 ENCOUNTER — Other Ambulatory Visit: Payer: Self-pay

## 2021-04-16 ENCOUNTER — Encounter: Payer: Self-pay | Admitting: Family Medicine

## 2021-04-16 ENCOUNTER — Ambulatory Visit (INDEPENDENT_AMBULATORY_CARE_PROVIDER_SITE_OTHER): Payer: BC Managed Care – PPO

## 2021-04-16 ENCOUNTER — Ambulatory Visit (INDEPENDENT_AMBULATORY_CARE_PROVIDER_SITE_OTHER): Payer: BC Managed Care – PPO | Admitting: Family Medicine

## 2021-04-16 VITALS — BP 114/62 | HR 92 | Ht 69.0 in | Wt 227.0 lb

## 2021-04-16 DIAGNOSIS — K529 Noninfective gastroenteritis and colitis, unspecified: Secondary | ICD-10-CM | POA: Diagnosis not present

## 2021-04-16 DIAGNOSIS — J188 Other pneumonia, unspecified organism: Secondary | ICD-10-CM

## 2021-04-16 DIAGNOSIS — J189 Pneumonia, unspecified organism: Secondary | ICD-10-CM

## 2021-04-16 DIAGNOSIS — R5383 Other fatigue: Secondary | ICD-10-CM | POA: Diagnosis not present

## 2021-04-16 DIAGNOSIS — R0602 Shortness of breath: Secondary | ICD-10-CM | POA: Diagnosis not present

## 2021-04-16 DIAGNOSIS — R059 Cough, unspecified: Secondary | ICD-10-CM | POA: Diagnosis not present

## 2021-04-16 NOTE — Progress Notes (Signed)
Acute Office Visit  Subjective:    Patient ID: Kristina Harrington, female    DOB: 1981/02/17, 40 y.o.   MRN: [id number]  No chief complaint on file.   HPI Patient is in today for right-sided pneumonia.  She was in [place] helping her mother and stepfather as he was on hospice.  He was exposed to a family member who had a GI bug.  She started having diarrhea and vomiting.  The vomiting only lasted about 24 hours but the diarrhea lasted about 5 days.,  By the weekend she started feeling worse and went to [facility].  They did a chest x-ray and told her she had a right-sided pneumonia they placed her on azithromycin and Omnicef. Completed a zpack and now on Om nicef.  She has been having sweats and chills.  She is feeling a little bit better today but she is completely exhausted she is short of breath with activity.  She was negative for COVID and flu.  Past Medical History:  Diagnosis Date   ADHD    Allergic rhinitis 12/11/2014   Dymista, singulair, claritin.     Hyperlipidemia 08/02/2016   Lumbar degenerative disc disease 09/20/2013    Past Surgical History:  Procedure Laterality Date   ARTHROSCOPIC REPAIR ACL  03/2015   KNEE SURGERY Left    ACL    Family History  Problem Relation Age of Onset   Healthy Mother    Healthy Father     Social History   Socioeconomic History   Marital status: Married    Spouse name: Not on file   Number of children: Not on file   Years of education: Not on file   Highest education level: Not on file  Occupational History   Not on file  Tobacco Use   Smoking status: Never   Smokeless tobacco: Never  Vaping Use   Vaping Use: Never used  Substance and Sexual Activity   Alcohol use: Not Currently   Drug use: No   Sexual activity: Not on file  Other Topics Concern   Not on file  Social History Narrative   Not on file   Social Determinants of Health   Financial Resource Strain: Not on file  Food Insecurity: Not on file   Transportation Needs: Not on file  Physical Activity: Not on file  Stress: Not on file  Social Connections: Not on file  Intimate Partner Violence: Not on file    Outpatient Medications Prior to Visit  Medication Sig Dispense Refill   amphetamine-dextroamphetamine (ADDERALL XR) 15 MG 24 hr capsule Take 1 capsule by mouth daily. 30 capsule 0   amphetamine-dextroamphetamine (ADDERALL XR) 15 MG 24 hr capsule Take 1 capsule by mouth every morning. 30 capsule 0   [START ON 04/17/2021] amphetamine-dextroamphetamine (ADDERALL XR) 20 MG 24 hr capsule Take 1 capsule (20 mg total) by mouth daily. 30 capsule 0   atorvastatin (LIPITOR) 10 MG tablet Take 1 tablet (10 mg total) by mouth daily. 90 tablet 3   CAMRESE 0.15-0.03 &0.01 MG tablet      cefdinir (OMNICEF) 300 MG capsule Take 300 mg by mouth 2 (two) times daily.     fluticasone (FLONASE) 50 MCG/ACT nasal spray Place 2 sprays into both nostrils daily.     guaiFENesin (MUCINEX) 600 MG 12 hr tablet Take 2 tablets (1,200 mg total) by mouth 2 (two) times daily. 30 tablet 2   levocetirizine (XYZAL) 5 MG tablet Take 5 mg by mouth every evening.  sertraline (ZOLOFT) 100 MG tablet Take 1.5 tablets (150 mg total) by mouth daily. 135 tablet 3   predniSONE (DELTASONE) 20 MG tablet Take 1 tablet (20 mg total) by mouth 2 (two) times daily with a meal. 10 tablet 0   No facility-administered medications prior to visit.    No Known Allergies  Review of Systems     Objective:    Physical Exam Constitutional:      Appearance: She is well-developed.  HENT:     Head: Normocephalic and atraumatic.  Cardiovascular:     Rate and Rhythm: Normal rate and regular rhythm.     Heart sounds: Normal heart sounds.  Pulmonary:     Effort: Pulmonary effort is normal.     Breath sounds: Normal breath sounds.  Skin:    General: Skin is warm and dry.  Neurological:     Mental Status: She is alert and oriented to person, place, and time.  Psychiatric:         Behavior: Behavior normal.    BP 114/62   Pulse 92   Ht 5\' 9"  (1.753 m)   Wt 227 lb (103 kg)   LMP 04/16/2021   SpO2 100%   BMI 33.52 kg/m  Wt Readings from Last 3 Encounters:  04/16/21 227 lb (103 kg)  03/16/21 231 lb (104.8 kg)  05/12/20 246 lb 11.2 oz (111.9 kg)    Health Maintenance Due  Topic Date Due   Hepatitis C Screening  Never done   COVID-19 Vaccine (3 - Booster) 05/02/2020   INFLUENZA VACCINE  04/13/2021    There are no preventive care reminders to display for this patient.   Lab Results  Component Value Date   TSH 1.58 08/14/2018   Lab Results  Component Value Date   WBC 7.1 08/14/2018   HGB 14.4 08/14/2018   HCT 41.4 08/14/2018   MCV 95.2 08/14/2018   PLT 273 08/14/2018   Lab Results  Component Value Date   NA 140 08/14/2018   K 4.2 08/14/2018   CO2 28 08/14/2018   GLUCOSE 86 08/14/2018   BUN 11 08/14/2018   CREATININE 0.80 08/14/2018   BILITOT 0.6 08/14/2018   ALKPHOS 121 (H) 08/19/2016   AST 94 (H) 08/14/2018   ALT 121 (H) 08/14/2018   PROT 7.4 08/14/2018   ALBUMIN 3.9 08/19/2016   CALCIUM 9.9 08/14/2018   Lab Results  Component Value Date   CHOL 285 (H) 08/14/2018   Lab Results  Component Value Date   HDL 49 (L) 08/14/2018   Lab Results  Component Value Date   LDLCALC 193 (H) 08/14/2018   Lab Results  Component Value Date   TRIG 234 (H) 08/14/2018   Lab Results  Component Value Date   CHOLHDL 5.8 (H) 08/14/2018   No results found for: HGBA1C     Assessment & Plan:   Problem List Items Addressed This Visit   None Visit Diagnoses     Pneumonia of right lung due to infectious organism, unspecified part of lung    -  Primary   Relevant Medications   cefdinir (OMNICEF) 300 MG capsule   Other Relevant Orders   DG Chest 2 View   Gastroenteritis          Discussed making sure to complete Omnicef.  We will get repeat chest x-ray today.  We will call and get records from the express care hopefully they can also send  14/10/2017 the results for the C. difficile culture that they  did.  I suspect it was mostly just gastroenteritis as that actually seems to be better.  Continue to push fluids and hydrate and try to eat as tolerated.  No orders of the defined types were placed in this encounter.    [name], MD

## 2021-04-27 DIAGNOSIS — F909 Attention-deficit hyperactivity disorder, unspecified type: Secondary | ICD-10-CM | POA: Diagnosis not present

## 2021-04-27 DIAGNOSIS — R748 Abnormal levels of other serum enzymes: Secondary | ICD-10-CM | POA: Diagnosis not present

## 2021-04-27 DIAGNOSIS — E781 Pure hyperglyceridemia: Secondary | ICD-10-CM | POA: Diagnosis not present

## 2021-04-27 LAB — COMPLETE METABOLIC PANEL WITH GFR
AG Ratio: 1.5 (calc) (ref 1.0–2.5)
ALT: 76 U/L — ABNORMAL HIGH (ref 6–29)
AST: 38 U/L — ABNORMAL HIGH (ref 10–30)
Albumin: 4.1 g/dL (ref 3.6–5.1)
Alkaline phosphatase (APISO): 125 U/L (ref 31–125)
BUN: 8 mg/dL (ref 7–25)
CO2: 27 mmol/L (ref 20–32)
Calcium: 9.1 mg/dL (ref 8.6–10.2)
Chloride: 106 mmol/L (ref 98–110)
Creat: 0.75 mg/dL (ref 0.50–0.97)
Globulin: 2.7 g/dL (calc) (ref 1.9–3.7)
Glucose, Bld: 88 mg/dL (ref 65–99)
Potassium: 4 mmol/L (ref 3.5–5.3)
Sodium: 139 mmol/L (ref 135–146)
Total Bilirubin: 0.5 mg/dL (ref 0.2–1.2)
Total Protein: 6.8 g/dL (ref 6.1–8.1)
eGFR: 104 mL/min/{1.73_m2} (ref >=60)

## 2021-04-27 LAB — CBC
HCT: 39.3 % (ref 35.0–45.0)
Hemoglobin: 13.6 g/dL (ref 11.7–15.5)
MCH: 33 pg (ref 27.0–33.0)
MCHC: 34.6 g/dL (ref 32.0–36.0)
MCV: 95.4 fL (ref 80.0–100.0)
MPV: 11.7 fL (ref 7.5–12.5)
Platelets: 252 10*3/uL (ref 140–400)
RBC: 4.12 10*6/uL (ref 3.80–5.10)
RDW: 11.9 % (ref 11.0–15.0)
WBC: 6.2 10*3/uL (ref 3.8–10.8)

## 2021-04-27 LAB — LIPID PANEL
Cholesterol: 172 mg/dL (ref <200)
HDL: 46 mg/dL — ABNORMAL LOW (ref >=50)
LDL Cholesterol (Calc): 101 mg/dL (calc) — ABNORMAL HIGH
Non-HDL Cholesterol (Calc): 126 mg/dL (calc) (ref <130)
Total CHOL/HDL Ratio: 3.7 (calc) (ref <5.0)
Triglycerides: 157 mg/dL — ABNORMAL HIGH (ref <150)

## 2021-06-01 ENCOUNTER — Encounter: Payer: Self-pay | Admitting: Family Medicine

## 2021-06-01 DIAGNOSIS — F430 Acute stress reaction: Secondary | ICD-10-CM

## 2021-06-01 MED ORDER — SERTRALINE HCL 100 MG PO TABS: 200.0000 mg | ORAL_TABLET | Freq: Every day | ORAL | 0 refills | Status: DC

## 2021-06-01 NOTE — Telephone Encounter (Signed)
Dose changed. Have her f/u with me in 4=6 weeks after change  Meds ordered this encounter  Medications   sertraline (ZOLOFT) 100 MG tablet    Sig: Take 2 tablets (200 mg total) by mouth daily.    Dispense:  180 tablet    Refill:  0

## 2021-06-12 ENCOUNTER — Other Ambulatory Visit: Payer: Self-pay | Admitting: Family Medicine

## 2021-06-12 ENCOUNTER — Telehealth: Payer: Self-pay | Admitting: Family Medicine

## 2021-06-12 MED ORDER — AMPHETAMINE-DEXTROAMPHET ER 20 MG PO CP24: 20.0000 mg | ORAL_CAPSULE | Freq: Every day | ORAL | 0 refills | Status: DC

## 2021-06-12 NOTE — Telephone Encounter (Signed)
Meds ordered this encounter  Medications  . amphetamine-dextroamphetamine (ADDERALL XR) 20 MG 24 hr capsule    Sig: Take 1 capsule (20 mg total) by mouth daily.    Dispense:  30 capsule    Refill:  0    

## 2021-06-12 NOTE — Telephone Encounter (Signed)
Patient scheduled an appointment via MyChart request (following up on Sertraline) and was wondering if she could have a refill on the medication listed below as she said she has two more pills left. Upcoming Appointment is on October 13th. AM   amphetamine-dextroamphetamine (ADDERALL XR) 20 MG 24 hr capsule   [facility] [address] [place], [place] - [address] Phone:  [phone]  Fax:  [phone]

## 2021-06-17 ENCOUNTER — Other Ambulatory Visit: Payer: Self-pay | Admitting: Medical-Surgical

## 2021-06-17 DIAGNOSIS — E785 Hyperlipidemia, unspecified: Secondary | ICD-10-CM

## 2021-06-18 NOTE — Telephone Encounter (Signed)
[place] pt 

## 2021-06-19 ENCOUNTER — Encounter: Payer: Self-pay | Admitting: Family Medicine

## 2021-06-19 NOTE — Progress Notes (Signed)
abstraction

## 2021-06-25 ENCOUNTER — Encounter: Payer: Self-pay | Admitting: Family Medicine

## 2021-06-25 ENCOUNTER — Ambulatory Visit (INDEPENDENT_AMBULATORY_CARE_PROVIDER_SITE_OTHER): Payer: BC Managed Care – PPO | Admitting: Family Medicine

## 2021-06-25 ENCOUNTER — Other Ambulatory Visit: Payer: Self-pay

## 2021-06-25 VITALS — BP 117/63 | HR 83 | Ht 69.0 in | Wt 227.0 lb

## 2021-06-25 DIAGNOSIS — F909 Attention-deficit hyperactivity disorder, unspecified type: Secondary | ICD-10-CM | POA: Diagnosis not present

## 2021-06-25 DIAGNOSIS — F424 Excoriation (skin-picking) disorder: Secondary | ICD-10-CM | POA: Diagnosis not present

## 2021-06-25 DIAGNOSIS — F430 Acute stress reaction: Secondary | ICD-10-CM | POA: Diagnosis not present

## 2021-06-25 MED ORDER — AMPHETAMINE-DEXTROAMPHET ER 20 MG PO CP24: 20.0000 mg | ORAL_CAPSULE | Freq: Every day | ORAL | 0 refills | Status: DC

## 2021-06-25 MED ORDER — AMPHETAMINE-DEXTROAMPHET ER 20 MG PO CP24: 20.0000 mg | ORAL_CAPSULE | ORAL | 0 refills | Status: DC

## 2021-06-25 NOTE — Progress Notes (Signed)
Acute Office Visit  Subjective:    Patient ID: Kristina Harrington, female    DOB: 10-28-1980, 40 y.o.   MRN: [id number]  Chief Complaint  Patient presents with   Follow-up    HPI Patient is in today for : sent a MyChart message about 4 weeks ago and we decided to increase her sertraline up to 200 mg daily.  She actually feels like it has been helpful.  She has not noticed any negative side effects or concerns.  She does notice at times that she will inadvertently pick at the skin and not even realize she is doing it until someone pointed out in fact she does it in her sleep some as well she said she had a history of doing that years ago when she was in college.  So she has been sleeping with socks on her arms to keep her hands from scratching or picking at her arms and legs.  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  But she has noticed improvement.  She did want to talk a little bit about a second dose possibly later in the day.  Past Medical History:  Diagnosis Date   ADHD    Allergic rhinitis 12/11/2014   Dymista, singulair, claritin.     Hyperlipidemia 08/02/2016   Lumbar degenerative disc disease 09/20/2013    Past Surgical History:  Procedure Laterality Date   ARTHROSCOPIC REPAIR ACL  03/2015   KNEE SURGERY Left    ACL    Family History  Problem Relation Age of Onset   Healthy Mother    Healthy Father     Social History   Socioeconomic History   Marital status: Married    Spouse name: Not on file   Number of children: Not on file   Years of education: Not on file   Highest education level: Not on file  Occupational History   Not on file  Tobacco Use   Smoking  status: Never   Smokeless tobacco: Never  Vaping Use   Vaping Use: Never used  Substance and Sexual Activity   Alcohol use: Not Currently   Drug use: No   Sexual activity: Not on file  Other Topics Concern   Not on file  Social History Narrative   Not on file   Social Determinants of Health   Financial Resource Strain: Not on file  Food Insecurity: Not on file  Transportation Needs: Not on file  Physical Activity: Not on file  Stress: Not on file  Social Connections: Not on file  Intimate Partner Violence: Not on file    Outpatient Medications Prior to Visit  Medication Sig Dispense Refill   atorvastatin (LIPITOR) 10 MG tablet Take 1 tablet by mouth once daily 30 tablet 0   CAMRESE 0.15-0.03 &0.01 MG tablet      fluticasone (FLONASE) 50 MCG/ACT nasal spray Place 2 sprays into both nostrils daily.     levocetirizine (XYZAL) 5 MG tablet Take 5 mg by mouth every evening.     sertraline (ZOLOFT) 100 MG tablet Take 2 tablets (200 mg total) by mouth daily. 180 tablet 0   amphetamine-dextroamphetamine (  ADDERALL XR) 15 MG 24 hr capsule Take 1 capsule by mouth daily. 30 capsule 0   amphetamine-dextroamphetamine (ADDERALL XR) 15 MG 24 hr capsule Take 1 capsule by mouth every morning. 30 capsule 0   amphetamine-dextroamphetamine (ADDERALL XR) 20 MG 24 hr capsule Take 1 capsule (20 mg total) by mouth daily. 30 capsule 0   No facility-administered medications prior to visit.    No Known Allergies  Review of Systems     Objective:    Physical Exam Constitutional:      Appearance: She is well-developed.  HENT:     Head: Normocephalic and atraumatic.  Cardiovascular:     Rate and Rhythm: Normal rate and regular rhythm.     Heart sounds: Normal heart sounds.  Pulmonary:     Effort: Pulmonary effort is normal.     Breath sounds: Normal breath sounds.  Skin:    General: Skin is warm and dry.  Neurological:     Mental Status: She is alert and oriented to person, place, and  time.  Psychiatric:        Behavior: Behavior normal.    BP 117/63   Pulse 83   Ht 5' 9"  (1.753 m)   Wt 227 lb (103 kg)   SpO2 99%   BMI 33.52 kg/m  Wt Readings from Last 3 Encounters:  06/25/21 227 lb (103 kg)  04/16/21 227 lb (103 kg)  03/16/21 231 lb (104.8 kg)    There are no preventive care reminders to display for this patient.   There are no preventive care reminders to display for this patient.   Lab Results  Component Value Date   TSH 1.58 08/14/2018   Lab Results  Component Value Date   WBC 6.2 04/27/2021   HGB 13.6 04/27/2021   HCT 39.3 04/27/2021   MCV 95.4 04/27/2021   PLT 252 04/27/2021   Lab Results  Component Value Date   NA 139 04/27/2021   K 4.0 04/27/2021   CO2 27 04/27/2021   GLUCOSE 88 04/27/2021   BUN 8 04/27/2021   CREATININE 0.75 04/27/2021   BILITOT 0.5 04/27/2021   ALKPHOS 121 (H) 08/19/2016   AST 38 (H) 04/27/2021   ALT 76 (H) 04/27/2021   PROT 6.8 04/27/2021   ALBUMIN 3.9 08/19/2016   CALCIUM 9.1 04/27/2021   EGFR 104 04/27/2021   Lab Results  Component Value Date   CHOL 172 04/27/2021   Lab Results  Component Value Date   HDL 46 (L) 04/27/2021   Lab Results  Component Value Date   LDLCALC 101 (H) 04/27/2021   Lab Results  Component Value Date   TRIG 157 (H) 04/27/2021   Lab Results  Component Value Date   CHOLHDL 3.7 04/27/2021   No results found for: HGBA1C     Assessment & Plan:   Problem List Items Addressed This Visit       Other   ADHD    Doing well on increased dose.  So we will go ahead and send the next 90-day supply call if any problems we did discuss the possibility of adding a shorter acting in the afternoon it certainly something we can consider but I did not send a new prescription today.      Relevant Medications   amphetamine-dextroamphetamine (ADDERALL XR) 20 MG 24 hr capsule   Acute stress reaction    Happy with increase in her regimen continue sertraline 2 mg daily.  Goal would be  to try to taper that  back down may be in the next 6 months.  We will keep an eye on that      Other Visit Diagnoses     Picking own skin    -  Primary      Skin picking she does not feel like it necessarily got worse after we increased her medication so we will just keep an eye on it again it is possible that medications are triggering some of this behavior but if she is able to manage it then we will just monitor since she does feel like she is getting significant benefit.  Meds ordered this encounter  Medications   amphetamine-dextroamphetamine (ADDERALL XR) 20 MG 24 hr capsule    Sig: Take 1 capsule (20 mg total) by mouth daily.    Dispense:  30 capsule    Refill:  0   amphetamine-dextroamphetamine (ADDERALL XR) 20 MG 24 hr capsule    Sig: Take 1 capsule (20 mg total) by mouth every morning.    Dispense:  30 capsule    Refill:  0   amphetamine-dextroamphetamine (ADDERALL XR) 20 MG 24 hr capsule    Sig: Take 1 capsule (20 mg total) by mouth daily.    Dispense:  30 capsule    Refill:  0     Beatrice Lecher, MD

## 2021-06-25 NOTE — Progress Notes (Signed)
Doing well on current dosage.  *pt has an appt to have her pap done in January with Dr. Laural Benes at [facility].

## 2021-06-26 NOTE — Assessment & Plan Note (Signed)
Happy with increase in her regimen continue sertraline 2 mg daily.  Goal would be to try to taper that back down may be in the next 6 months.  We will keep an eye on that

## 2021-06-26 NOTE — Assessment & Plan Note (Signed)
Doing well on increased dose.  So we will go ahead and send the next 90-day supply call if any problems we did discuss the possibility of adding a shorter acting in the afternoon it certainly something we can consider but I did not send a new prescription today.

## 2021-07-29 ENCOUNTER — Other Ambulatory Visit: Payer: Self-pay | Admitting: Family Medicine

## 2021-07-29 DIAGNOSIS — E785 Hyperlipidemia, unspecified: Secondary | ICD-10-CM

## 2021-08-10 DIAGNOSIS — J329 Chronic sinusitis, unspecified: Secondary | ICD-10-CM | POA: Diagnosis not present

## 2021-08-10 DIAGNOSIS — R5383 Other fatigue: Secondary | ICD-10-CM | POA: Diagnosis not present

## 2021-08-10 DIAGNOSIS — E559 Vitamin D deficiency, unspecified: Secondary | ICD-10-CM | POA: Diagnosis not present

## 2021-08-10 DIAGNOSIS — F909 Attention-deficit hyperactivity disorder, unspecified type: Secondary | ICD-10-CM | POA: Diagnosis not present

## 2021-08-11 LAB — HEPATIC FUNCTION PANEL
ALT: 69 U/L — AB (ref 7–35)
AST: 48 — AB (ref 13–35)
Alkaline Phosphatase: 142 — AB (ref 25–125)

## 2021-09-01 ENCOUNTER — Other Ambulatory Visit: Payer: Self-pay | Admitting: Family Medicine

## 2021-09-01 DIAGNOSIS — F430 Acute stress reaction: Secondary | ICD-10-CM

## 2021-09-01 MED ORDER — SERTRALINE HCL 100 MG PO TABS: 200.0000 mg | ORAL_TABLET | Freq: Every day | ORAL | 0 refills | Status: DC

## 2021-09-14 ENCOUNTER — Encounter: Payer: Self-pay | Admitting: Family Medicine

## 2021-09-17 ENCOUNTER — Ambulatory Visit (INDEPENDENT_AMBULATORY_CARE_PROVIDER_SITE_OTHER): Payer: BC Managed Care – PPO | Admitting: Family Medicine

## 2021-09-17 ENCOUNTER — Encounter: Payer: Self-pay | Admitting: Family Medicine

## 2021-09-17 ENCOUNTER — Other Ambulatory Visit: Payer: Self-pay

## 2021-09-17 VITALS — BP 136/77 | HR 91 | Ht 69.0 in | Wt 227.0 lb

## 2021-09-17 DIAGNOSIS — Z23 Encounter for immunization: Secondary | ICD-10-CM | POA: Diagnosis not present

## 2021-09-17 NOTE — Progress Notes (Signed)
Patient comes to office for vaccine injection. Administered with no apparent complications.  Patient is here for a vitamin TDAP injection. TDAP injection to left deltoid with no apparent complications.

## 2021-09-17 NOTE — Progress Notes (Signed)
Agree with documentation as above.   Jeffre Enriques, MD  

## 2021-09-30 LAB — HM PAP SMEAR: HPV, high-risk: NEGATIVE

## 2021-10-03 ENCOUNTER — Emergency Department
Admission: RE | Admit: 2021-10-03 | Discharge: 2021-10-03 | Disposition: A | Discharge status: Home / Self Care | Payer: BC Managed Care – PPO | Source: Ambulatory Visit | Attending: Family Medicine | Admitting: Family Medicine

## 2021-10-03 ENCOUNTER — Ambulatory Visit: Payer: Self-pay

## 2021-10-03 ENCOUNTER — Other Ambulatory Visit: Payer: Self-pay

## 2021-10-03 VITALS — BP 120/77 | HR 86 | Temp 98.5°F | Ht 69.0 in | Wt 230.0 lb

## 2021-10-03 DIAGNOSIS — J069 Acute upper respiratory infection, unspecified: Secondary | ICD-10-CM

## 2021-10-03 DIAGNOSIS — J302 Other seasonal allergic rhinitis: Secondary | ICD-10-CM

## 2021-10-03 LAB — POC SARS CORONAVIRUS 2 AG -  ED: SARS Coronavirus 2 Ag: NEGATIVE

## 2021-10-03 MED ORDER — AMOXICILLIN-POT CLAVULANATE 875-125 MG PO TABS: ORAL_TABLET | ORAL | 0 refills | Status: DC

## 2021-10-03 MED ORDER — PREDNISONE 20 MG PO TABS: ORAL_TABLET | ORAL | 0 refills | Status: DC

## 2021-10-03 NOTE — ED Provider Notes (Signed)
[facility]    CSN: [id number] Arrival date & time: 10/03/21  1448      History   Chief Complaint Chief Complaint  Patient presents with   Appointment   Otalgia    HPI Kristina Harrington is a 41 y.o. female.   Six days ago patient began to develop bilateral ear pressure and fullness, fatigue, and headache.  She has had nasal drainage with facial pressure, not improved with a neti pot.  She has seasonal allergic rhinitis but her symptoms have not improved Xyzal.  She denies cough but has developed tightness in her anterior chest, and developed fever this morning.  The history is provided by the patient.   Past Medical History:  Diagnosis Date   ADHD    Allergic rhinitis 12/11/2014   Dymista, singulair, claritin.     Hyperlipidemia 08/02/2016   Lumbar degenerative disc disease 09/20/2013    Patient Active Problem List   Diagnosis Date Noted   ADHD 11/06/2020   Acute stress reaction 08/02/2016   Hyperlipidemia 08/02/2016   Rupture of anterior cruciate ligament of left knee 03/28/2015   Allergic rhinitis 12/11/2014   Iliotibial band syndrome of right side 11/02/2013   Lumbar degenerative disc disease 09/20/2013    Past Surgical History:  Procedure Laterality Date   ARTHROSCOPIC REPAIR ACL  03/2015   KNEE SURGERY Left    ACL    OB History   No obstetric history on file.      Home Medications    Prior to Admission medications   Medication Sig Start Date End Date Taking? Authorizing Provider  amoxicillin-clavulanate (AUGMENTIN) 875-125 MG tablet Take one tab PO Q12hr with food 10/03/21  Yes Priyana Mccarey, Ishmael Holter, MD  amphetamine-dextroamphetamine (ADDERALL XR) 20 MG 24 hr capsule Take 1 capsule (20 mg total) by mouth daily. 06/25/21  Yes Hali Marry, MD  amphetamine-dextroamphetamine (ADDERALL XR) 20 MG 24 hr capsule Take 1 capsule (20 mg total) by mouth every morning. 06/25/21  Yes Hali Marry, MD  amphetamine-dextroamphetamine (ADDERALL  XR) 20 MG 24 hr capsule Take 1 capsule (20 mg total) by mouth daily. 06/25/21  Yes Hali Marry, MD  atorvastatin (LIPITOR) 10 MG tablet Take 1 tablet by mouth once daily 07/30/21  Yes Hali Marry, MD  CAMRESE 0.15-0.03 &0.01 MG tablet  05/24/16  Yes [provider]  fluticasone (FLONASE) 50 MCG/ACT nasal spray Place 2 sprays into both nostrils daily. 01/19/21  Yes [provider]  levocetirizine (XYZAL) 5 MG tablet Take 5 mg by mouth every evening.   Yes [provider]  predniSONE (DELTASONE) 20 MG tablet Take one tab by mouth twice daily for 4 days, then one daily. Take with food. 10/03/21  Yes Kandra Nicolas, MD  sertraline (ZOLOFT) 100 MG tablet Take 2 tablets (200 mg total) by mouth daily. 09/01/21  Yes Hali Marry, MD    Family History Family History  Problem Relation Age of Onset   Healthy Mother    Healthy Father     Social History Social History   Tobacco Use   Smoking status: Never   Smokeless tobacco: Never  Vaping Use   Vaping Use: Never used  Substance Use Topics   Alcohol use: Not Currently   Drug use: No     Allergies   Patient has no known allergies.   Review of Systems Review of Systems ? sore throat No cough No pleuritic pain No wheezing + nasal congestion + post-nasal drainage + sinus  pain/pressure No itchy/red eyes + earache No hemoptysis No SOB + fever, + chills No nausea No vomiting No abdominal pain No diarrhea No urinary symptoms No skin rash + fatigue No myalgias + headache Used OTC meds (Xyzal) without relief   Physical Exam Triage Vital Signs ED Triage Vitals  Enc Vitals Group     BP 10/03/21 1512 120/77     Pulse Rate 10/03/21 1512 86     Resp --      Temp 10/03/21 1512 98.5 F (36.9 C)     Temp Source 10/03/21 1512 Oral     SpO2 10/03/21 1512 98 %     Weight 10/03/21 1513 230 lb (104.3 kg)     Height 10/03/21 1513 5\' 9"  (1.753 m)     Head Circumference --       Peak Flow --      Pain Score 10/03/21 1513 0     Pain Loc --      Pain Edu? --      Excl. in [place]? --    No data found.  Updated Vital Signs BP 120/77 (BP Location: Right Arm)    Pulse 86    Temp 98.5 F (36.9 C) (Oral)    Ht 5\' 9"  (1.753 m)    Wt 104.3 kg    SpO2 98%    BMI 33.97 kg/m   Visual Acuity Right Eye Distance:   Left Eye Distance:   Bilateral Distance:    Right Eye Near:   Left Eye Near:    Bilateral Near:     Physical Exam Nursing notes and Vital Signs reviewed. Appearance:  Patient appears stated age, and in no acute distress Eyes:  Pupils are equal, round, and reactive to light and accomodation.  Extraocular movement is intact.  Conjunctivae are not inflamed  Ears:  Canals normal.  Tympanic membranes normal.  Nose:  Congested turbinates.  Left maxillary sinus tenderness is present.  Pharynx:  Normal Neck:  Supple.  Mildly enlarged lateral nodes are present, tender to palpation on the left.   Lungs:  Clear to auscultation.  Breath sounds are equal.  Moving air well. Chest:  Distinct tenderness to palpation over the mid-sternum.  Heart:  Regular rate and rhythm without murmurs, rubs, or gallops.  Abdomen:  Nontender without masses or hepatosplenomegaly.  Bowel sounds are present.  No CVA or flank tenderness.  Extremities:  No edema.  Skin:  No rash present.   UC Treatments / Results  Labs (all labs ordered are listed, but only abnormal results are displayed) Labs Reviewed  POC SARS CORONAVIRUS 2 AG -  ED  Tympanometry:  Right ear tympanogram normal; Left ear tympanogram normal  EKG   Radiology No results found.  Procedures Procedures (including critical care time)  Medications Ordered in UC Medications - No data to display  Initial Impression / Assessment and Plan / UC Course  I have reviewed the triage vital signs and the nursing notes.  Pertinent labs & imaging results that were available during my care of the patient were reviewed by me and  considered in my medical decision making (see chart for details).    Patient may be developing left maxillary sinusitis.  Begin Augmentin and prednisone burst/taper. Followup with Family Doctor if not improved in one week.   Final Clinical Impressions(s) / UC Diagnoses   Final diagnoses:  Viral URI  Seasonal allergic rhinitis, unspecified trigger     Discharge Instructions      Recommend performing  at least two home COVID19 tests on two consecutive days. Take plain guaifenesin (1200mg  extended release tabs such as Mucinex) twice daily, with plenty of water, for cough and congestion. Get adequate rest.   Continue using saline nasal spray several times daily and saline nasal irrigation.  Try warm salt water gargles for sore throat.  Stop all antihistamines (Xyzal, etc) for now, and other non-prescription cough/cold preparations. If cough develops, may take Delsym Cough Suppressant ("12 Hour Cough Relief") at bedtime for nighttime cough.      ED Prescriptions     Medication Sig Dispense Auth. Provider   amoxicillin-clavulanate (AUGMENTIN) 875-125 MG tablet Take one tab PO Q12hr with food 14 tablet Kandra Nicolas, MD   predniSONE (DELTASONE) 20 MG tablet Take one tab by mouth twice daily for 4 days, then one daily. Take with food. 12 tablet Kandra Nicolas, MD         Kandra Nicolas, MD 10/04/21 [phone]

## 2021-10-03 NOTE — Discharge Instructions (Signed)
Recommend performing at least two home COVID19 tests on two consecutive days. Take plain guaifenesin (1200mg  extended release tabs such as Mucinex) twice daily, with plenty of water, for cough and congestion. Get adequate rest.   Continue using saline nasal spray several times daily and saline nasal irrigation.  Try warm salt water gargles for sore throat.  Stop all antihistamines (Xyzal, etc) for now, and other non-prescription cough/cold preparations. If cough develops, may take Delsym Cough Suppressant ("12 Hour Cough Relief") at bedtime for nighttime cough.

## 2021-10-03 NOTE — ED Triage Notes (Signed)
Patient c/o sinus drainage, nasal drainage, bilateral ear pain, no cough x 7 days.  Patient has tried a Nettipot.

## 2021-10-19 ENCOUNTER — Encounter: Payer: Self-pay | Admitting: Family Medicine

## 2021-10-19 DIAGNOSIS — F430 Acute stress reaction: Secondary | ICD-10-CM

## 2021-10-19 MED ORDER — SERTRALINE HCL 100 MG PO TABS: 200.0000 mg | ORAL_TABLET | Freq: Every day | ORAL | 0 refills | Status: DC

## 2021-10-26 ENCOUNTER — Ambulatory Visit: Payer: BC Managed Care – PPO | Admitting: Family Medicine

## 2021-10-26 ENCOUNTER — Encounter: Payer: Self-pay | Admitting: Family Medicine

## 2021-10-26 ENCOUNTER — Other Ambulatory Visit: Payer: Self-pay

## 2021-10-26 VITALS — BP 122/76 | HR 90 | Resp 18 | Ht 69.0 in | Wt 240.0 lb

## 2021-10-26 DIAGNOSIS — F909 Attention-deficit hyperactivity disorder, unspecified type: Secondary | ICD-10-CM | POA: Diagnosis not present

## 2021-10-26 DIAGNOSIS — F430 Acute stress reaction: Secondary | ICD-10-CM | POA: Diagnosis not present

## 2021-10-26 MED ORDER — AMPHETAMINE-DEXTROAMPHET ER 20 MG PO CP24: 20.0000 mg | ORAL_CAPSULE | Freq: Every day | ORAL | 0 refills | Status: DC

## 2021-10-26 MED ORDER — AMPHETAMINE-DEXTROAMPHET ER 20 MG PO CP24: 20.0000 mg | ORAL_CAPSULE | ORAL | 0 refills | Status: DC

## 2021-10-26 MED ORDER — SERTRALINE HCL 100 MG PO TABS: 150.0000 mg | ORAL_TABLET | Freq: Every day | ORAL | 1 refills | Status: DC

## 2021-10-26 NOTE — Assessment & Plan Note (Signed)
Well controlled. Continue current regimen. Follow up in  6 mo.  May run into issue with backorder so please let us know if we need to make some changes.

## 2021-10-26 NOTE — Assessment & Plan Note (Signed)
Ports that overall she is actually doing well.  PHQ 9 score of 2 and GAD-7 score of 1.  Continue with sertraline 150 mg daily.  Updated prescription sent to pharmacy.  Follow-up in 6 months

## 2021-10-26 NOTE — Progress Notes (Signed)
Established Patient Office Visit  Subjective:  Patient ID: Kristina Harrington, female    DOB: 1980/11/02  Age: 41 y.o. MRN: [id number]  CC:  Chief Complaint  Patient presents with   ADHD    Follow up     HPI Kristina Harrington presents for   ADHD - Reports symptoms are well controlled on current regime. Denies any problems with insomnia, chest pain, palpitations, or SOB.     F/U Mood -she is now on 150 mg of sertraline.  She is doing really well overall she ended up seeing someone in integrative health in [place] and they put her on a methyl folate and B12 supplement and she is also taking another when she just does not remember the name of it and it seems to really be helping she also started some over-the-counter magnesium to help with her sleep and feels like that is been effective as well she was really struggling for a while.  Past Medical History:  Diagnosis Date   ADHD    Allergic rhinitis 12/11/2014   Dymista, singulair, claritin.     Hyperlipidemia 08/02/2016   Lumbar degenerative disc disease 09/20/2013    Past Surgical History:  Procedure Laterality Date   ARTHROSCOPIC REPAIR ACL  03/2015   KNEE SURGERY Left    ACL    Family History  Problem Relation Age of Onset   Healthy Mother    Healthy Father     Social History   Socioeconomic History   Marital status: Married    Spouse name: Not on file   Number of children: Not on file   Years of education: Not on file   Highest education level: Not on file  Occupational History   Not on file  Tobacco Use   Smoking status: Never   Smokeless tobacco: Never  Vaping Use   Vaping Use: Never used  Substance and Sexual Activity   Alcohol use: Not Currently   Drug use: No   Sexual activity: Not on file  Other Topics Concern   Not on file  Social History Narrative   Not on file   Social Determinants of Health   Financial Resource Strain: Not on file  Food Insecurity: Not on file  Transportation Needs:  Not on file  Physical Activity: Not on file  Stress: Not on file  Social Connections: Not on file  Intimate Partner Violence: Not on file    Outpatient Medications Prior to Visit  Medication Sig Dispense Refill   atorvastatin (LIPITOR) 10 MG tablet Take 1 tablet by mouth once daily 90 tablet 3   CAMRESE 0.15-0.03 &0.01 MG tablet      fluticasone (FLONASE) 50 MCG/ACT nasal spray Place 2 sprays into both nostrils daily.     levocetirizine (XYZAL) 5 MG tablet Take 5 mg by mouth every evening.     amphetamine-dextroamphetamine (ADDERALL XR) 20 MG 24 hr capsule Take 1 capsule (20 mg total) by mouth daily. 30 capsule 0   amphetamine-dextroamphetamine (ADDERALL XR) 20 MG 24 hr capsule Take 1 capsule (20 mg total) by mouth every morning. 30 capsule 0   amphetamine-dextroamphetamine (ADDERALL XR) 20 MG 24 hr capsule Take 1 capsule (20 mg total) by mouth daily. 30 capsule 0   sertraline (ZOLOFT) 100 MG tablet Take 2 tablets (200 mg total) by mouth daily. (Patient taking differently: Take 150 mg by mouth daily. Patient takes 150 mg daily) 60 tablet 0   amoxicillin-clavulanate (AUGMENTIN) 875-125 MG tablet Take one tab PO Q12hr with food  14 tablet 0   predniSONE (DELTASONE) 20 MG tablet Take one tab by mouth twice daily for 4 days, then one daily. Take with food. 12 tablet 0   No facility-administered medications prior to visit.    No Known Allergies  ROS Review of Systems    Objective:    Physical Exam Constitutional:      Appearance: Normal appearance. She is well-developed.  HENT:     Head: Normocephalic and atraumatic.  Cardiovascular:     Rate and Rhythm: Normal rate and regular rhythm.     Heart sounds: Normal heart sounds.  Pulmonary:     Effort: Pulmonary effort is normal.     Breath sounds: Normal breath sounds.  Skin:    General: Skin is warm and dry.  Neurological:     Mental Status: She is alert and oriented to person, place, and time.  Psychiatric:        Behavior:  Behavior normal.   BP 122/76    Pulse 90    Resp 18    Ht _0  (1.753 m)    Wt 240 lb (108.9 kg)    SpO2 98%    BMI 35.44 kg/m  Wt Readings from Last 3 Encounters:  10/26/21 240 lb (108.9 kg)  10/03/21 230 lb (104.3 kg)  09/17/21 227 lb (103 kg)     There are no preventive care reminders to display for this patient.  There are no preventive care reminders to display for this patient.  Lab Results  Component Value Date   TSH 1.58 08/14/2018   Lab Results  Component Value Date   WBC 6.2 04/27/2021   HGB 13.6 04/27/2021   HCT 39.3 04/27/2021   MCV 95.4 04/27/2021   PLT 252 04/27/2021   Lab Results  Component Value Date   NA 139 04/27/2021   K 4.0 04/27/2021   CO2 27 04/27/2021   GLUCOSE 88 04/27/2021   BUN 8 04/27/2021   CREATININE 0.75 04/27/2021   BILITOT 0.5 04/27/2021   ALKPHOS 121 (H) 08/19/2016   AST 38 (H) 04/27/2021   ALT 76 (H) 04/27/2021   PROT 6.8 04/27/2021   ALBUMIN 3.9 08/19/2016   CALCIUM 9.1 04/27/2021   EGFR 104 04/27/2021   Lab Results  Component Value Date   CHOL 172 04/27/2021   Lab Results  Component Value Date   HDL 46 (L) 04/27/2021   Lab Results  Component Value Date   LDLCALC 101 (H) 04/27/2021   Lab Results  Component Value Date   TRIG 157 (H) 04/27/2021   Lab Results  Component Value Date   CHOLHDL 3.7 04/27/2021   No results found for: HGBA1C    Assessment & Plan:   Problem List Items Addressed This Visit       Other   ADHD - Primary    Well controlled. Continue current regimen. Follow up in  6 mo.  May run into issue with backorder so please let us know if we need to make some changes.      Relevant Medications   amphetamine-dextroamphetamine (ADDERALL XR) 20 MG 24 hr capsule (Start on 12/22/2021)   Acute stress reaction    Ports that overall she is actually doing well.  PHQ 9 score of 2 and GAD-7 score of 1.  Continue with sertraline 150 mg daily.  Updated prescription sent to pharmacy.  Follow-up in 6  months      Relevant Medications   sertraline (ZOLOFT) 100 MG tablet  Meds ordered this encounter  Medications   amphetamine-dextroamphetamine (ADDERALL XR) 20 MG 24 hr capsule    Sig: Take 1 capsule (20 mg total) by mouth daily.    Dispense:  30 capsule    Refill:  0   amphetamine-dextroamphetamine (ADDERALL XR) 20 MG 24 hr capsule    Sig: Take 1 capsule (20 mg total) by mouth daily.    Dispense:  30 capsule    Refill:  0   amphetamine-dextroamphetamine (ADDERALL XR) 20 MG 24 hr capsule    Sig: Take 1 capsule (20 mg total) by mouth every morning.    Dispense:  30 capsule    Refill:  0   sertraline (ZOLOFT) 100 MG tablet    Sig: Take 1.5 tablets (150 mg total) by mouth daily. Patient takes 150 mg daily    Dispense:  135 tablet    Refill:  1    Follow-up: Return in about 6 months (around 04/25/2022) for ADHD .    Beatrice Lecher, MD

## 2021-11-02 ENCOUNTER — Encounter: Payer: Self-pay | Admitting: Family Medicine

## 2021-11-02 NOTE — Telephone Encounter (Signed)
Would start with seeing if maybe they have a smaller dose like 10 or 15 that we might be able to put together or substitute.  Some patients have been able to find alternative doses that we can order and if that is not available at all then we can switch to something like Concerta.  Or if her insurance will cover Mydayis that is an option as well.  But she would need checked with her insurance to see if they would cover that 1.

## 2021-11-25 LAB — BASIC METABOLIC PANEL
Creatinine: 0.8 (ref 0.5–1.1)
Glucose: 183
Potassium: 3.5 mEq/L (ref 3.5–5.1)
Sodium: 141 (ref 137–147)

## 2021-11-25 LAB — HEPATIC FUNCTION PANEL
ALT: 37 U/L — AB (ref 7–35)
Alkaline Phosphatase: 142 — AB (ref 25–125)

## 2021-11-25 LAB — TSH: TSH: 1.9 (ref 0.41–5.90)

## 2021-11-25 LAB — VITAMIN B12: Vitamin B-12: >2000

## 2021-12-06 ENCOUNTER — Other Ambulatory Visit: Payer: Self-pay | Admitting: Family Medicine

## 2021-12-08 ENCOUNTER — Telehealth: Payer: Self-pay

## 2021-12-08 NOTE — Telephone Encounter (Signed)
Initiated Prior authorization ZOX:[id number] XR 20MG  er capsules ?Via: Covermymeds ?Case/Key:[id number] ?Status: approved as of 12/08/21 ?Reason:Coverage Starts on: 12/08/2021 12:00:00 AM, Coverage Ends on: 12/08/2022 12:00:00 AM. ?Notified Pt via: Mychart ?

## 2022-01-14 ENCOUNTER — Emergency Department
Admission: RE | Admit: 2022-01-14 | Discharge: 2022-01-14 | Disposition: A | Discharge status: Home / Self Care | Payer: BC Managed Care – PPO | Source: Ambulatory Visit | Attending: Emergency Medicine | Admitting: Emergency Medicine

## 2022-01-14 VITALS — BP 119/81 | HR 83 | Temp 98.8°F | Resp 20 | Ht 69.0 in | Wt 236.0 lb

## 2022-01-14 DIAGNOSIS — J0100 Acute maxillary sinusitis, unspecified: Secondary | ICD-10-CM | POA: Diagnosis not present

## 2022-01-14 DIAGNOSIS — J029 Acute pharyngitis, unspecified: Secondary | ICD-10-CM

## 2022-01-14 LAB — POCT RAPID STREP A (OFFICE): Rapid Strep A Screen: NEGATIVE

## 2022-01-14 MED ORDER — IBUPROFEN 600 MG PO TABS: 600.0000 mg | ORAL_TABLET | Freq: Four times a day (QID) | ORAL | 0 refills | Status: DC | PRN

## 2022-01-14 MED ORDER — AMOXICILLIN-POT CLAVULANATE 875-125 MG PO TABS: 1.0000 | ORAL_TABLET | Freq: Two times a day (BID) | ORAL | 0 refills | Status: DC

## 2022-01-14 NOTE — ED Provider Notes (Signed)
HPI ? ?SUBJECTIVE: ? ?Kristina Harrington is a 41 y.o. female who presents with intermittent right ear pain for the past week, and sore throat starting today.  She reports sinus pain and pressure, yellow rhinorrhea, postnasal drip, occasional dry cough, and occasional nausea.  Also reports sneezing, itchy, watery eyes.  No fevers, body aches, headaches, facial swelling, abdominal pain, loss of sense of smell or taste, wheezing, shortness of breath, vomiting, diarrhea, abdominal pain, rash, change in hearing, otorrhea, vertigo.  No known COVID or flu exposure.  She got COVID and flu vaccines.  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  No GERD symptoms currently.  She has tried salt water gargles, Nettie pot, ibuprofen, Xyzal, humidifier without improvement in her symptoms.  Talking and swallowing make this throat and ear pain worse.  She has a past medical history of severe allergies and is receiving allergy shots.  She takes Xyzal on an ongoing basis, which she states usually works for her.  She also has a history of fatty liver disease.  LMP: March.  Denies possibility being pregnant. ? ? ? ?Past Medical History:  ?Diagnosis Date  ? ADHD   ? Allergic rhinitis 12/11/2014  ? Dymista, singulair, claritin.    ? Hyperlipidemia 08/02/2016  ? Lumbar degenerative disc disease 09/20/2013  ? ? ?Past Surgical History:  ?Procedure Laterality Date  ? ARTHROSCOPIC REPAIR ACL  03/2015  ? KNEE SURGERY Left   ? ACL  ? ? ?Family History  ?Problem Relation Age of Onset  ? Osteoporosis Mother   ? Constipation Mother   ? Healthy Father   ? ? ?Social History  ? ?Tobacco Use  ? Smoking status: Never  ? Smokeless tobacco: Never  ?Vaping Use  ? Vaping Use: Never used  ?Substance Use Topics  ? Alcohol use: Not Currently  ? Drug use: No  ? ? ?No current facility-administered medications for this encounter. ? ?Current Outpatient Medications:  ?  amoxicillin-clavulanate (AUGMENTIN) 875-125 MG tablet, Take 1 tablet by mouth 2 (two) times  daily. X 7 days, Disp: 14 tablet, Rfl: 0 ?  ibuprofen (ADVIL) 600 MG tablet, Take 1 tablet (600 mg total) by mouth every 6 (six) hours as needed., Disp: 30 tablet, Rfl: 0 ?  amphetamine-dextroamphetamine (ADDERALL XR) 20 MG 24 hr capsule, Take 1 capsule (20 mg total) by mouth daily., Disp: 30 capsule, Rfl: 0 ?  amphetamine-dextroamphetamine (ADDERALL XR) 20 MG 24 hr capsule, Take 1 capsule (20 mg total) by mouth daily., Disp: 30 capsule, Rfl: 0 ?  amphetamine-dextroamphetamine (ADDERALL XR) 20 MG 24 hr capsule, Take 1 capsule (20 mg total) by mouth every morning., Disp: 30 capsule, Rfl: 0 ?  atorvastatin (LIPITOR) 10 MG tablet, Take 1 tablet by mouth once daily, Disp: 90 tablet, Rfl: 3 ?  CAMRESE 0.15-0.03 &0.01 MG tablet, , Disp: , Rfl:  ?  fluticasone (FLONASE) 50 MCG/ACT nasal spray, Place 2 sprays into both nostrils daily., Disp: , Rfl:  ?  sertraline (ZOLOFT) 100 MG tablet, Take 1.5 tablets (150 mg total) by mouth daily. Patient takes 150 mg daily, Disp: 135 tablet, Rfl: 1 ? ?No Known Allergies ? ? ?ROS ? ?As noted in HPI.  ? ?Physical Exam ? ?BP 119/81 (BP Location: Right Arm)   Pulse 83   Temp 98.8 ?F (37.1 ?C) (Oral)   Resp 20   Ht 5\' 9"  (1.753 m)   Wt 107 kg   LMP 11/25/2021 (Approximate) Comment: 3 months of oral contraceptive before periods  SpO2  98%   BMI 34.85 kg/m?  ? ?Constitutional: Well developed, well nourished, no acute distress ?Eyes:  EOMI, conjunctiva normal bilaterally ?HENT: Normocephalic, atraumatic,mucus membranes moist.  Right external ear, EAC, TM normal.  No pain with traction on pinna, palpation of tragus.  Left TM normal.  No tenderness over the TMJs bilaterally.  No crepitus.  Erythematous, swollen turbinates with purulent nasal congestion.  Positive right maxillary sinus tenderness.  Slightly erythematous tonsils, normal size without exudates.  Uvula midline.  Positive postnasal drip. ?Neck: Negative cervical lymphadenopathy ?Respiratory: Normal inspiratory effort, lungs  clear bilaterally ?Cardiovascular: Normal rate, regular rhythm no murmurs ?GI: nondistended soft, nontender, no splenomegaly ?skin: No rash, skin intact ?Musculoskeletal: no deformities ?Neurologic: Alert & oriented x 3, no focal neuro deficits ?Psychiatric: Speech and behavior appropriate ? ? ?ED Course ? ? ?Medications - No data to display ? ?Orders Placed This Encounter  ?Procedures  ? Culture, Group A Strep  ?  Standing Status:   Standing  ?  Number of Occurrences:   1  ? POCT rapid strep A  ?  Standing Status:   Standing  ?  Number of Occurrences:   1  ? ? ?Results for orders placed or performed during the hospital encounter of 01/14/22 (from the past 24 hour(s))  ?POCT rapid strep A     Status: None  ? Collection Time: 01/14/22  6:15 PM  ?Result Value Ref Range  ? Rapid Strep A Screen Negative Negative  ? ?No results found. ? ?ED Clinical Impression ? ?1. Acute non-recurrent maxillary sinusitis   ?2. Sore throat   ?  ? ?ED Assessment/Plan ? ?Rapid strep negative.  Sending throat culture off because of the multiple exposures to strep at work.  Will extend Augmentin course to 10 days if it comes back positive. ? ?Presentation consistent with a right maxillary sinusitis.  Will send home with Augmentin, have her restart Flonase, continue Nettie pot, discontinue Xyzal, start Mucinex D, home with ibuprofen 600 mg, no Tylenol because of the fatty liver disease, Benadryl/Maalox mixture.  Follow-up with PCP as needed.  ER return precautions given. ? ?Discussed labs, MDM, treatment plan, and plan for follow-up with patient. Discussed sn/sx that should prompt return to the ED. patient agrees with plan.  ? ?Meds ordered this encounter  ?Medications  ? amoxicillin-clavulanate (AUGMENTIN) 875-125 MG tablet  ?  Sig: Take 1 tablet by mouth 2 (two) times daily. X 7 days  ?  Dispense:  14 tablet  ?  Refill:  0  ? ibuprofen (ADVIL) 600 MG tablet  ?  Sig: Take 1 tablet (600 mg total) by mouth every 6 (six) hours as needed.  ?   Dispense:  30 tablet  ?  Refill:  0  ? ? ? ? ?*This clinic note was created using Scientist, clinical (histocompatibility and immunogenetics). Therefore, there may be occasional mistakes despite careful proofreading. ? ?? ? ?  ?Domenick Gong, MD ?01/15/22 1232 ? ?

## 2022-01-14 NOTE — ED Triage Notes (Addendum)
Pt presents to Urgent Care with c/o sore throat since this AM. Reports having nasal congestion x approx one week, but she attributes this to allergies. Also reports chest soreness with deep inhalation. 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 Negative home covid test today.  ?

## 2022-01-14 NOTE — Discharge Instructions (Addendum)
your rapid strep was negative today, so we have sent off a throat culture.  We will contact you and extend your antibiotics to 10 days if it comes back positive.  Finish the Augmentin, even if you feel better.  Discontinue Xyzal, start Mucinex D, restart Flonase, continue saline nasal irrigation.  600 mg ibuprofen  3-4 times a day as needed for pain.  Make sure you drink plenty of extra fluids.  Some people find salt water gargles and  Traditional Medicinal's "Throat Coat" tea helpful. Take 5 mL of liquid Benadryl and 5 mL of Maalox. Mix it together, and then hold it in your mouth for as long as you can and then swallow. You may do this 4 times a day.   ? ?Go to www.goodrx.com  or www.costplusdrugs.com to look up your medications. This will give you a list of where you can find your prescriptions at the most affordable prices. Or ask the pharmacist what the cash price is, or if they have any other discount programs available to help make your medication more affordable. This can be less expensive than what you would pay with insurance.   ?

## 2022-01-18 LAB — CULTURE, GROUP A STREP: Strep A Culture: NEGATIVE

## 2022-01-19 ENCOUNTER — Ambulatory Visit: Payer: BC Managed Care – PPO | Admitting: Family Medicine

## 2022-01-19 ENCOUNTER — Encounter: Payer: Self-pay | Admitting: Family Medicine

## 2022-01-19 VITALS — BP 129/68 | HR 104 | Resp 16 | Ht 69.0 in | Wt 240.0 lb

## 2022-01-19 DIAGNOSIS — J329 Chronic sinusitis, unspecified: Secondary | ICD-10-CM

## 2022-01-19 DIAGNOSIS — F909 Attention-deficit hyperactivity disorder, unspecified type: Secondary | ICD-10-CM | POA: Diagnosis not present

## 2022-01-19 DIAGNOSIS — J40 Bronchitis, not specified as acute or chronic: Secondary | ICD-10-CM

## 2022-01-19 MED ORDER — AMPHETAMINE-DEXTROAMPHET ER 20 MG PO CP24: 20.0000 mg | ORAL_CAPSULE | Freq: Every day | ORAL | 0 refills | Status: DC

## 2022-01-19 MED ORDER — AZITHROMYCIN 250 MG PO TABS: ORAL_TABLET | ORAL | 0 refills | Status: AC

## 2022-01-19 MED ORDER — PREDNISONE 20 MG PO TABS: 40.0000 mg | ORAL_TABLET | Freq: Every day | ORAL | 0 refills | Status: DC

## 2022-01-19 MED ORDER — AMPHETAMINE-DEXTROAMPHET ER 20 MG PO CP24: 20.0000 mg | ORAL_CAPSULE | ORAL | 0 refills | Status: DC

## 2022-01-19 NOTE — Progress Notes (Signed)
? ?Acute Office Visit ? ?Subjective:  ? ?  ?Patient ID: Kristina Harrington, female    DOB: 1981/02/07, 41 y.o.   MRN: [id number] ? ?Chief Complaint  ?Patient presents with  ? Cough  ?  Productive cough, fatigue, chest congestion, postnasal drainage, slight facial pressure/pain. Patient currently on Augmentin. Patient stated symptoms are getting worse.   ? ? ?HPI ?Patient is in today for Productive cough, fatigue, chest congestion, postnasal drainage, slight facial pressure/pain. Patient currently on Augmentin. Patient stated symptoms are getting worse.  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  She says initially it would just start with throat pain and ear pain but eventually turned more into a productive cough and nasal congestion she has had a lot of sinus pressure particularly over the maxillary sinuses and going into her upper teeth.  She does feel little short of breath with cough.  She sometimes is waking up at night with the cough.  She is on day 5 of Augmentin and actually feels like she is getting worse in fact she had to leave work early today by about 11:00 she just felt like she wanted to lay down and take a nap. ? ?ROS ? ? ?   ?Objective:  ?  ?BP 129/68   Pulse (!) 104   Resp 16   Ht 5\' 9"  (1.753 m)   Wt 240 lb (108.9 kg)   LMP 01/14/2022   SpO2 98%   BMI 35.44 kg/m?  ? ? ?Physical Exam ?Constitutional:   ?   Appearance: She is well-developed.  ?HENT:  ?   Head: Normocephalic and atraumatic.  ?   Right Ear: External ear normal.  ?   Left Ear: External ear normal.  ?   Nose: Nose normal.  ?Eyes:  ?   Conjunctiva/sclera: Conjunctivae normal.  ?   Pupils: Pupils are equal, round, and reactive to light.  ?Neck:  ?   Thyroid: No thyromegaly.  ?Cardiovascular:  ?   Rate and Rhythm: Normal rate and regular rhythm.  ?   Heart sounds: Normal heart sounds.  ?Pulmonary:  ?   Effort: Pulmonary effort is  normal.  ?   Breath sounds: Normal breath sounds. No wheezing.  ?Musculoskeletal:  ?   Cervical back: Neck supple.  ?Lymphadenopathy:  ?   Cervical: No cervical adenopathy.  ?Skin: ?   General: Skin is warm and dry.  ?Neurological:  ?   Mental Status: She is alert and oriented to person, place, and time.  ? ? ?No results found for any visits on 01/19/22. ? ? ?   ?Assessment & Plan:  ? ?Problem List Items Addressed This Visit   ? ?  ? Other  ? ADHD  ? Relevant Medications  ? amphetamine-dextroamphetamine (ADDERALL XR) 20 MG 24 hr capsule (Start on 03/19/2022)  ? amphetamine-dextroamphetamine (ADDERALL XR) 20 MG 24 hr capsule (Start on 02/18/2022)  ? amphetamine-dextroamphetamine (ADDERALL XR) 20 MG 24 hr capsule  ? ?Other Visit Diagnoses   ? ? Sinobronchitis    -  Primary  ? Relevant Medications  ? azithromycin (ZITHROMAX) 250 MG tablet  ? predniSONE (DELTASONE) 20 MG tablet  ? ?  ? ?Sinobronchitis - start zpack and prednisone. Finish out the last 2 days of Augmentin.  Call if not better in next week.  Stop Decongestant.   ? ?Meds ordered this encounter  ?Medications  ?  azithromycin (ZITHROMAX) 250 MG tablet  ?  Sig: 2 Ttabs PO on Day 1, then one a day x 4 days.  ?  Dispense:  6 tablet  ?  Refill:  0  ? predniSONE (DELTASONE) 20 MG tablet  ?  Sig: Take 2 tablets (40 mg total) by mouth daily with breakfast.  ?  Dispense:  10 tablet  ?  Refill:  0  ? amphetamine-dextroamphetamine (ADDERALL XR) 20 MG 24 hr capsule  ?  Sig: Take 1 capsule (20 mg total) by mouth daily.  ?  Dispense:  30 capsule  ?  Refill:  0  ? amphetamine-dextroamphetamine (ADDERALL XR) 20 MG 24 hr capsule  ?  Sig: Take 1 capsule (20 mg total) by mouth daily.  ?  Dispense:  30 capsule  ?  Refill:  0  ? amphetamine-dextroamphetamine (ADDERALL XR) 20 MG 24 hr capsule  ?  Sig: Take 1 capsule (20 mg total) by mouth every morning.  ?  Dispense:  30 capsule  ?  Refill:  0  ? ? ?No follow-ups on file. ? ?[name], MD ? ? ?

## 2022-01-20 ENCOUNTER — Emergency Department (INDEPENDENT_AMBULATORY_CARE_PROVIDER_SITE_OTHER): Payer: BC Managed Care – PPO

## 2022-01-20 ENCOUNTER — Emergency Department (INDEPENDENT_AMBULATORY_CARE_PROVIDER_SITE_OTHER)
Admission: RE | Admit: 2022-01-20 | Discharge: 2022-01-20 | Disposition: A | Discharge status: Home / Self Care | Payer: BC Managed Care – PPO | Source: Ambulatory Visit

## 2022-01-20 VITALS — BP 124/85 | HR 88 | Temp 97.7°F | Resp 19

## 2022-01-20 DIAGNOSIS — R059 Cough, unspecified: Secondary | ICD-10-CM | POA: Diagnosis not present

## 2022-01-20 DIAGNOSIS — J189 Pneumonia, unspecified organism: Secondary | ICD-10-CM | POA: Diagnosis not present

## 2022-01-20 MED ORDER — BENZONATATE 200 MG PO CAPS: 200.0000 mg | ORAL_CAPSULE | Freq: Three times a day (TID) | ORAL | 0 refills | Status: AC | PRN

## 2022-01-20 MED ORDER — PROMETHAZINE-DM 6.25-15 MG/5ML PO SYRP: 5.0000 mL | ORAL_SOLUTION | Freq: Two times a day (BID) | ORAL | 0 refills | Status: DC | PRN

## 2022-01-20 MED ORDER — AMOXICILLIN-POT CLAVULANATE 875-125 MG PO TABS: 1.0000 | ORAL_TABLET | Freq: Two times a day (BID) | ORAL | 0 refills | Status: AC

## 2022-01-20 NOTE — ED Provider Notes (Signed)
?[facility] ? ? ? ?CSN: [id number] ?Arrival date & time: 01/20/22  1746 ? ? ?  ? ?History   ?Chief Complaint ?Chief Complaint  ?Patient presents with  ? Follow-up  ?  Almost done with antibiotic for one week but now My symptoms have worsened. I am now coughing up chunks of mucus and my chest hurts - Entered by patient  ? Sinusitis  ? ? ?HPI ?Kristina Harrington is a 41 y.o. female.  ? ?HPI 41 year old female presents with continued sinus infection.  Patient was evaluated here on 01/14/2022 and prescribed Augmentin twice daily for 7 days.  Patient was evaluated by her primary care doctor yesterday for sinobronchitis and was treated with Zithromax and prednisone.  Patient reports symptoms have worsened in the past 24 hours with more labored breathing and having lung pain.  Patient had a history of pneumonia last summer.  PMH significant for HLD and ADHD.  ? ?Past Medical History:  ?Diagnosis Date  ? ADHD   ? Allergic rhinitis 12/11/2014  ? Dymista, singulair, claritin.    ? Hyperlipidemia 08/02/2016  ? Lumbar degenerative disc disease 09/20/2013  ? ? ?Patient Active Problem List  ? Diagnosis Date Noted  ? ADHD 11/06/2020  ? Acute stress reaction 08/02/2016  ? Hyperlipidemia 08/02/2016  ? Rupture of anterior cruciate ligament of left knee 03/28/2015  ? Allergic rhinitis 12/11/2014  ? Iliotibial band syndrome of right side 11/02/2013  ? Lumbar degenerative disc disease 09/20/2013  ? ? ?Past Surgical History:  ?Procedure Laterality Date  ? ARTHROSCOPIC REPAIR ACL  03/2015  ? KNEE SURGERY Left   ? ACL  ? ? ?OB History   ?No obstetric history on file. ?  ? ? ? ?Home Medications   ? ?Prior to Admission medications   ?Medication Sig Start Date End Date Taking? Authorizing Provider  ?benzonatate (TESSALON) 200 MG capsule Take 1 capsule (200 mg total) by mouth 3 (three) times daily as needed for up to 7 days for cough. 01/20/22 01/27/22 Yes Trevor Iha, FNP  ?promethazine-dextromethorphan (PROMETHAZINE-DM) 6.25-15 MG/5ML  syrup Take 5 mLs by mouth 2 (two) times daily as needed for cough. 01/20/22  Yes Trevor Iha, FNP  ?amoxicillin-clavulanate (AUGMENTIN) 875-125 MG tablet Take 1 tablet by mouth 2 (two) times daily for 3 days. X 7 days 01/20/22 01/23/22  Trevor Iha, FNP  ?amphetamine-dextroamphetamine (ADDERALL XR) 20 MG 24 hr capsule Take 1 capsule (20 mg total) by mouth daily. 03/19/22   Agapito Games, MD  ?amphetamine-dextroamphetamine (ADDERALL XR) 20 MG 24 hr capsule Take 1 capsule (20 mg total) by mouth daily. 02/18/22   Agapito Games, MD  ?amphetamine-dextroamphetamine (ADDERALL XR) 20 MG 24 hr capsule Take 1 capsule (20 mg total) by mouth every morning. 01/19/22   Agapito Games, MD  ?atorvastatin (LIPITOR) 10 MG tablet Take 1 tablet by mouth once daily 07/30/21   Agapito Games, MD  ?azithromycin (ZITHROMAX) 250 MG tablet 2 Ttabs PO on Day 1, then one a day x 4 days. 01/19/22 01/24/22  Agapito Games, MD  ?CAMRESE 0.15-0.03 &0.01 MG tablet  05/24/16   [provider]  ?fluticasone (FLONASE) 50 MCG/ACT nasal spray Place 2 sprays into both nostrils daily. 01/19/21   [provider]  ?ibuprofen (ADVIL) 600 MG tablet Take 1 tablet (600 mg total) by mouth every 6 (six) hours as needed. 01/14/22   Domenick Gong, MD  ?predniSONE (DELTASONE) 20 MG tablet Take 2 tablets (40 mg total) by mouth daily with breakfast. 01/19/22  Agapito GamesMetheney, Catherine D, MD  ?sertraline (ZOLOFT) 100 MG tablet Take 1.5 tablets (150 mg total) by mouth daily. Patient takes 150 mg daily 10/26/21   Agapito GamesMetheney, Catherine D, MD  ? ? ?Family History ?Family History  ?Problem Relation Age of Onset  ? Osteoporosis Mother   ? Constipation Mother   ? Healthy Father   ? ? ?Social History ?Social History  ? ?Tobacco Use  ? Smoking status: Never  ? Smokeless tobacco: Never  ?Vaping Use  ? Vaping Use: Never used  ?Substance Use Topics  ? Alcohol use: Not Currently  ? Drug use: No  ? ? ? ?Allergies   ?Patient has no known  allergies. ? ? ?Review of Systems ?Review of Systems  ?Respiratory:  Positive for cough.   ?All other systems reviewed and are negative. ? ? ?Physical Exam ?Triage Vital Signs ?ED Triage Vitals  ?Enc Vitals Group  ?   BP 01/20/22 1800 124/85  ?   Pulse Rate 01/20/22 1800 88  ?   Resp 01/20/22 1800 19  ?   Temp 01/20/22 1800 97.7 ?F (36.5 ?C)  ?   Temp Source 01/20/22 1800 Oral  ?   SpO2 01/20/22 1800 98 %  ?   Weight --   ?   Height --   ?   Head Circumference --   ?   Peak Flow --   ?   Pain Score 01/20/22 1757 2  ?   Pain Loc --   ?   Pain Edu? --   ?   Excl. in GC? --   ? ?No data found. ? ?Updated Vital Signs ?BP 124/85 (BP Location: Right Arm)   Pulse 88   Temp 97.7 ?F (36.5 ?C) (Oral)   Resp 19   LMP  (LMP Unknown)   SpO2 98%  ? ? ?Physical Exam ?Vitals and nursing note reviewed.  ?Constitutional:   ?   General: She is not in acute distress. ?   Appearance: Normal appearance. She is obese. She is not ill-appearing.  ?HENT:  ?   Head: Normocephalic and atraumatic.  ?   Right Ear: Tympanic membrane, ear canal and external ear normal.  ?   Left Ear: Tympanic membrane, ear canal and external ear normal.  ?   Mouth/Throat:  ?   Mouth: Mucous membranes are moist.  ?   Pharynx: Oropharynx is clear.  ?Eyes:  ?   Extraocular Movements: Extraocular movements intact.  ?   Conjunctiva/sclera: Conjunctivae normal.  ?   Pupils: Pupils are equal, round, and reactive to light.  ?Cardiovascular:  ?   Rate and Rhythm: Normal rate and regular rhythm.  ?   Pulses: Normal pulses.  ?   Heart sounds: Normal heart sounds. No murmur heard. ?Pulmonary:  ?   Effort: Pulmonary effort is normal.  ?   Breath sounds: Normal breath sounds. No wheezing, rhonchi or rales.  ?Musculoskeletal:  ?   Cervical back: Normal range of motion and neck supple. No tenderness.  ?Lymphadenopathy:  ?   Cervical: No cervical adenopathy.  ?Skin: ?   General: Skin is warm and dry.  ?Neurological:  ?   General: No focal deficit present.  ?   Mental Status:  She is alert and oriented to person, place, and time.  ? ? ? ?UC Treatments / Results  ?Labs ?(all labs ordered are listed, but only abnormal results are displayed) ?Labs Reviewed - No data to display ? ?EKG ? ? ?Radiology ?DG Chest 2  View ? ?Result Date: 01/20/2022 ?CLINICAL DATA:  Cough, EXAM: CHEST - 2 VIEW COMPARISON:  04/16/2021 FINDINGS: Discoid atelectasis within the right mid lung zone. Sparse focal pulmonary infiltrate noted within the infrahilar right lower lobe. No pneumothorax or pleural effusion. Cardiac size within normal limits. Pulmonary vascularity is normal. No acute bone abnormality. IMPRESSION: Right lower lobar pneumonic infiltrate. Electronically Signed   By: Helyn Numbers M.D.   On: 01/20/2022 18:33   ? ?Procedures ?Procedures (including critical care time) ? ?Medications Ordered in UC ?Medications - No data to display ? ?Initial Impression / Assessment and Plan / UC Course  ?I have reviewed the triage vital signs and the nursing notes. ? ?Pertinent labs & imaging results that were available during my care of the patient were reviewed by me and considered in my medical decision making (see chart for details). ? ?  ? ?MDM: 1.  Pneumonia of right lower lobe due to infectious organism -Rx'd additional Augmentin; 2. Cough-CXR revealed above, Rx'd Tessalon Perles and Promethazine DM. Advised/informed patient of chest x-ray results this evening with hard copy provided to patient.  Advised patient to take both Augmentin and Zithromax to completion.  Advised patient we have added 3 more days of Augmentin to make 20 total or twice daily for 10 days.  Advised patient to take Zithromax with first dose of Augmentin until completion.  Advised may take Tessalon Perles daily or as needed for cough.  Advised may use Promethazine DM in the late afternoon early evening for cough due to sedative effects.  Advised patient not to use Tessalon Perles and Promethazine DM together.  Encouraged patient to increase  daily water intake while taking these medications.  Advised patient if symptoms worsen and/or unresolved please follow-up with PCP or here for further evaluation.  Work note provided to patient prior to discharge.  D

## 2022-01-20 NOTE — Discharge Instructions (Addendum)
Advised/informed patient of chest x-ray results this evening with hard copy provided to patient.  Advised patient to take both Augmentin and Zithromax to completion.  Advised patient we have added 3 more days of Augmentin to make 20 total or twice daily for 10 days.  Advised patient to take Zithromax with first dose of Augmentin until completion.  Advised may take Tessalon Perles daily or as needed for cough.  Advised may use Promethazine DM in the late afternoon early evening for cough due to sedative effects.  Advised patient not to use Tessalon Perles and Promethazine DM together.  Encouraged patient to increase daily water intake while taking these medications.  Advised patient if symptoms worsen and/or unresolved please follow-up with PCP or here for further evaluation. ?

## 2022-01-20 NOTE — ED Triage Notes (Signed)
Pt c/o continued sinus infection. Was seen here last week, tx with Augmentin. No improvement. Saw PCP yesterday, switched to Zpak and also given prednisone. States worse in last 24 hours, breathing is more labored and having "lung pain". Hx of pneumonia last summer. Motrin prn in addition to abx and prednisone.  ?

## 2022-01-28 ENCOUNTER — Emergency Department (HOSPITAL_BASED_OUTPATIENT_CLINIC_OR_DEPARTMENT_OTHER)
Admission: EM | Admit: 2022-01-28 | Discharge: 2022-01-28 | Disposition: A | Discharge status: Other Acute Inpatient Hospital | Payer: BC Managed Care – PPO | Attending: Emergency Medicine | Admitting: Emergency Medicine

## 2022-01-28 ENCOUNTER — Emergency Department: Admit: 2022-01-28 | Payer: Self-pay

## 2022-01-28 ENCOUNTER — Emergency Department
Admission: EM | Admit: 2022-01-28 | Discharge: 2022-01-28 | Disposition: A | Discharge status: Home / Self Care | Payer: BC Managed Care – PPO | Source: Home / Self Care | Attending: Family Medicine | Admitting: Family Medicine

## 2022-01-28 ENCOUNTER — Other Ambulatory Visit: Payer: Self-pay

## 2022-01-28 ENCOUNTER — Emergency Department (HOSPITAL_BASED_OUTPATIENT_CLINIC_OR_DEPARTMENT_OTHER): Payer: BC Managed Care – PPO

## 2022-01-28 DIAGNOSIS — M79662 Pain in left lower leg: Secondary | ICD-10-CM | POA: Diagnosis not present

## 2022-01-28 DIAGNOSIS — M79605 Pain in left leg: Secondary | ICD-10-CM | POA: Diagnosis not present

## 2022-01-28 DIAGNOSIS — M7989 Other specified soft tissue disorders: Secondary | ICD-10-CM | POA: Diagnosis not present

## 2022-01-28 DIAGNOSIS — I82432 Acute embolism and thrombosis of left popliteal vein: Secondary | ICD-10-CM | POA: Insufficient documentation

## 2022-01-28 LAB — CBC WITH DIFFERENTIAL/PLATELET
Abs Immature Granulocytes: 0.03 10*3/uL (ref 0.00–0.07)
Basophils Absolute: 0.1 10*3/uL (ref 0.0–0.1)
Basophils Relative: 0 %
Eosinophils Absolute: 0.2 10*3/uL (ref 0.0–0.5)
Eosinophils Relative: 1 %
HCT: 38.5 % (ref 36.0–46.0)
Hemoglobin: 13.5 g/dL (ref 12.0–15.0)
Immature Granulocytes: 0 %
Lymphocytes Relative: 37 %
Lymphs Abs: 4.4 10*3/uL — ABNORMAL HIGH (ref 0.7–4.0)
MCH: 32.3 pg (ref 26.0–34.0)
MCHC: 35.1 g/dL (ref 30.0–36.0)
MCV: 92.1 fL (ref 80.0–100.0)
Monocytes Absolute: 0.5 10*3/uL (ref 0.1–1.0)
Monocytes Relative: 4 %
Neutro Abs: 6.9 10*3/uL (ref 1.7–7.7)
Neutrophils Relative %: 58 %
Platelets: 253 10*3/uL (ref 150–400)
RBC: 4.18 MIL/uL (ref 3.87–5.11)
RDW: 11.3 % — ABNORMAL LOW (ref 11.5–15.5)
WBC: 12.1 10*3/uL — ABNORMAL HIGH (ref 4.0–10.5)
nRBC: 0 % (ref 0.0–0.2)

## 2022-01-28 LAB — BASIC METABOLIC PANEL
Anion gap: 8 (ref 5–15)
BUN: 13 mg/dL (ref 6–20)
CO2: 25 mmol/L (ref 22–32)
Calcium: 9.5 mg/dL (ref 8.9–10.3)
Chloride: 103 mmol/L (ref 98–111)
Creatinine, Ser: 0.64 mg/dL (ref 0.44–1.00)
GFR, Estimated: >60 mL/min (ref >60)
Glucose, Bld: 123 mg/dL — ABNORMAL HIGH (ref 70–99)
Potassium: 3.4 mmol/L — ABNORMAL LOW (ref 3.5–5.1)
Sodium: 136 mmol/L (ref 135–145)

## 2022-01-28 LAB — PREGNANCY, URINE: Preg Test, Ur: NEGATIVE

## 2022-01-28 MED ORDER — RIVAROXABAN (XARELTO) VTE STARTER PACK (15 & 20 MG): 15.0000 mg | ORAL_TABLET | Freq: Two times a day (BID) | ORAL | 0 refills | Status: DC

## 2022-01-28 MED ORDER — RIVAROXABAN 15 MG PO TABS
15.0000 mg | ORAL_TABLET | Freq: Once | ORAL | Status: AC
Start: 2022-01-28 — End: 2022-01-28
  Administered 2022-01-28: 15 mg via ORAL
  Filled 2022-01-28: qty 1

## 2022-01-28 NOTE — ED Provider Notes (Signed)
[facility] EMERGENCY DEPT Provider Note   CSN: [id number] Arrival date & time: 01/28/22  2014     History  Chief Complaint  Patient presents with   Leg Pain    Kristina Harrington is a 41 y.o. female.  Patient is a 41 year old female who presents with leg swelling.  She states that she was recently treated for pneumonia.  She had cough and congestion.  She was treated with antibiotics and has subsequently finished the antibiotics.  She says that those symptoms have markedly improved.  She denies any shortness of breath.  No chest pain.  Over the last 2 days she has had some swelling of her left leg.  She says the leg feels tight and is cramping.  No history of prior DVTs.  No fevers.  She has been relatively immobile during this treatment for pneumonia.  She is on oral contraceptives.  She went to an urgent care and was sent here for further evaluation of DVT.      Home Medications Prior to Admission medications   Medication Sig Start Date End Date Taking? Authorizing Provider  RIVAROXABAN Carlena Hurl) VTE STARTER PACK (15 & 20 MG) Take 15 mg by mouth 2 (two) times daily for 21 days. Follow package directions: Take one 15mg  tablet by mouth twice a day. On day 22, switch to one 20mg  tablet once a day. Take with food. 01/28/22 02/18/22 Yes 01/30/22, MD  amphetamine-dextroamphetamine (ADDERALL XR) 20 MG 24 hr capsule Take 1 capsule (20 mg total) by mouth daily. 03/19/22   Rolan Bucco, MD  amphetamine-dextroamphetamine (ADDERALL XR) 20 MG 24 hr capsule Take 1 capsule (20 mg total) by mouth daily. 02/18/22   Agapito Games, MD  amphetamine-dextroamphetamine (ADDERALL XR) 20 MG 24 hr capsule Take 1 capsule (20 mg total) by mouth every morning. 01/19/22   Agapito Games, MD  atorvastatin (LIPITOR) 10 MG tablet Take 1 tablet by mouth once daily 07/30/21   Agapito Games, MD  CAMRESE 0.15-0.03 &0.01 MG tablet  05/24/16   [provider]  fluticasone  (FLONASE) 50 MCG/ACT nasal spray Place 2 sprays into both nostrils daily. 01/19/21   [provider]  ibuprofen (ADVIL) 600 MG tablet Take 1 tablet (600 mg total) by mouth every 6 (six) hours as needed. 01/14/22   03/21/21, MD  predniSONE (DELTASONE) 20 MG tablet Take 2 tablets (40 mg total) by mouth daily with breakfast. 01/19/22   Domenick Gong, MD  promethazine-dextromethorphan (PROMETHAZINE-DM) 6.25-15 MG/5ML syrup Take 5 mLs by mouth 2 (two) times daily as needed for cough. 01/20/22   11-15-1990, FNP  sertraline (ZOLOFT) 100 MG tablet Take 1.5 tablets (150 mg total) by mouth daily. Patient takes 150 mg daily 10/26/21   Trevor Iha, MD      Allergies    Patient has no known allergies.    Review of Systems   Review of Systems  Constitutional:  Positive for fatigue. Negative for chills, diaphoresis and fever.  HENT:  Negative for congestion, rhinorrhea and sneezing.   Eyes: Negative.   Respiratory:  Positive for cough. Negative for chest tightness and shortness of breath.   Cardiovascular:  Positive for leg swelling. Negative for chest pain.  Gastrointestinal:  Negative for abdominal pain, blood in stool, diarrhea, nausea and vomiting.  Genitourinary:  Negative for difficulty urinating, flank pain, frequency and hematuria.  Musculoskeletal:  Positive for myalgias (Leg pain). Negative for arthralgias and back pain.  Skin:  Negative for rash.  Neurological:  Negative for dizziness, speech difficulty, weakness, numbness and headaches.   Physical Exam Updated Vital Signs BP 122/81   Pulse 79   Temp 98.2 F (36.8 C) (Oral)   Resp 18   Ht 5\' 9"  (1.753 m)   Wt 105.7 kg   LMP  (LMP Unknown)   SpO2 98%   BMI 34.41 kg/m  Physical Exam Constitutional:      Appearance: She is well-developed.  HENT:     Head: Normocephalic and atraumatic.  Eyes:     Pupils: Pupils are equal, round, and reactive to light.  Cardiovascular:     Rate and Rhythm: Normal  rate and regular rhythm.     Heart sounds: Normal heart sounds.  Pulmonary:     Effort: Pulmonary effort is normal. No respiratory distress.     Breath sounds: Normal breath sounds. No wheezing or rales.  Chest:     Chest wall: No tenderness.  Abdominal:     General: Bowel sounds are normal.     Palpations: Abdomen is soft.     Tenderness: There is no abdominal tenderness. There is no guarding or rebound.  Musculoskeletal:        General: Normal range of motion.     Cervical back: Normal range of motion and neck supple.     Comments: 1+ edema of the left lower extremity.  No edema to the right.  Pulses are intact.  No warmth or erythema.  Mild tenderness to the calf.  Lymphadenopathy:     Cervical: No cervical adenopathy.  Skin:    General: Skin is warm and dry.     Findings: No rash.  Neurological:     Mental Status: She is alert and oriented to person, place, and time.    ED Results / Procedures / Treatments   Labs (all labs ordered are listed, but only abnormal results are displayed) Labs Reviewed  BASIC METABOLIC PANEL - Abnormal; Notable for the following components:      Result Value   Potassium 3.4 (*)    Glucose, Bld 123 (*)    All other components within normal limits  CBC WITH DIFFERENTIAL/PLATELET - Abnormal; Notable for the following components:   WBC 12.1 (*)    RDW 11.3 (*)    Lymphs Abs 4.4 (*)    All other components within normal limits  PREGNANCY, URINE    EKG None  Radiology US Venous Img Lower Unilateral Left (DVT)  Result Date: 01/28/2022 CLINICAL DATA:  Left lower extremity pain and cramping, pneumonia, oral contraceptive therapy EXAM: LEFT LOWER EXTREMITY VENOUS DOPPLER ULTRASOUND TECHNIQUE: Gray-scale sonography with graded compression, as well as color Doppler and duplex ultrasound were performed to evaluate the lower extremity deep venous systems from the level of the common femoral vein and including the common femoral, femoral, profunda  femoral, popliteal and calf veins including the posterior tibial, peroneal and gastrocnemius veins when visible. The superficial great saphenous vein was also interrogated. Spectral Doppler was utilized to evaluate flow at rest and with distal augmentation maneuvers in the common femoral, femoral and popliteal veins. COMPARISON:  None Available. FINDINGS: Contralateral Common Femoral Vein: Respiratory phasicity is normal and symmetric with the symptomatic side. No evidence of thrombus. Normal compressibility. Common Femoral Vein: No evidence of thrombus. Normal compressibility, respiratory phasicity and response to augmentation. Saphenofemoral Junction: No evidence of thrombus. Normal compressibility and flow on color Doppler imaging. Profunda Femoral Vein: No evidence of thrombus. Normal compressibility and flow on color Doppler imaging.  Femoral Vein: No evidence of thrombus. Normal compressibility, respiratory phasicity and response to augmentation. Popliteal Vein: There is occlusive thrombus within the peripheral popliteal vein. The saphenopopliteal junction is patent though nonocclusive thrombus is present in the region of the anastomosis. Calf Veins: There is occlusive thrombus within at least 1 of the 2 paired posterior tibial veins Superficial Great Saphenous Vein: No evidence of thrombus. Normal compressibility. Other Findings:  None. IMPRESSION: Occlusive DVT within the left popliteal vein and at least 1 of the 2 paired posterior tibial veins of the calf. Electronically Signed   By: Helyn Numbers M.D.   On: 01/28/2022 21:59    Procedures Procedures    Medications Ordered in ED Medications  Rivaroxaban (XARELTO) tablet 15 mg (has no administration in time range)    ED Course/ Medical Decision Making/ A&P                           Medical Decision Making Problems Addressed: Acute deep vein thrombosis (DVT) of popliteal vein of left lower extremity (HCC): acute illness or injury that poses a  threat to life or bodily functions  Amount and/or Complexity of Data Reviewed External Data Reviewed: labs. Labs: ordered. Decision-making details documented in ED Course. Radiology: ordered. Decision-making details documented in ED Course.  Risk Prescription drug management. Drug therapy requiring intensive monitoring for toxicity. Decision regarding hospitalization.   Patient is a 41 year old female who presents with some mild swelling and cramping of her left leg.  She has been relatively immobile for the last couple of weeks related to pneumonia and she is on oral contraceptives so she is at risk for DVT.  She did have an ultrasound of her leg.  This shows a DVT in the left popliteal vein.  She has mild swelling to the leg.  She does not have any symptoms that are more concerning for PE.  No hypoxia, shortness of breath or chest pain.  Her labs are nonconcerning.  We will start the patient on Xarelto.  Had a discussion with the patient about being treated with anticoagulants.  Her vital signs are stable.  At this point I feel that she can be treated as an outpatient.  She does not meet inpatient hospitalization criteria.  She was discharged home in good condition.  She was advised to have close follow-up with her PCP.  Return precautions were given.  Final Clinical Impression(s) / ED Diagnoses Final diagnoses:  Acute deep vein thrombosis (DVT) of popliteal vein of left lower extremity (HCC)    Rx / DC Orders ED Discharge Orders          Ordered    RIVAROXABAN (XARELTO) VTE STARTER PACK (15 & 20 MG)  2 times daily        01/28/22 2324              Rolan Bucco, MD 01/28/22 2325

## 2022-01-28 NOTE — Discharge Instructions (Signed)
With leg pain and swelling, inactivity and hormone use, I believe you should be evaluated for a blood clot.  GO to ER

## 2022-01-28 NOTE — ED Provider Notes (Signed)
[facility]    CSN: [id number] Arrival date & time: 01/28/22  1915      History   Chief Complaint Chief Complaint  Patient presents with   Leg Pain    And cramping     HPI Kristina Harrington is a 41 y.o. female.   HPI Patient was diagnosed with pneumonia 2 days ago.  She has been staying off of her feet..  Feeling very tired and weak.  She is a non-smoker.  She is on birth control pills.  She has never had trouble with a blood clot before.  She has pain and swelling in the left calf down to her ankle.  Pain with ambulation.  Concern for blood clot. No activity or injury to cause pain Past Medical History:  Diagnosis Date   ADHD    Allergic rhinitis 12/11/2014   Dymista, singulair, claritin.     Hyperlipidemia 08/02/2016   Lumbar degenerative disc disease 09/20/2013    Patient Active Problem List   Diagnosis Date Noted   ADHD 11/06/2020   Acute stress reaction 08/02/2016   Hyperlipidemia 08/02/2016   Rupture of anterior cruciate ligament of left knee 03/28/2015   Allergic rhinitis 12/11/2014   Iliotibial band syndrome of right side 11/02/2013   Lumbar degenerative disc disease 09/20/2013    Past Surgical History:  Procedure Laterality Date   ARTHROSCOPIC REPAIR ACL  03/2015   KNEE SURGERY Left    ACL    OB History   No obstetric history on file.      Home Medications    Prior to Admission medications   Medication Sig Start Date End Date Taking? Authorizing Provider  amphetamine-dextroamphetamine (ADDERALL XR) 20 MG 24 hr capsule Take 1 capsule (20 mg total) by mouth daily. 03/19/22   Agapito Games, MD  amphetamine-dextroamphetamine (ADDERALL XR) 20 MG 24 hr capsule Take 1 capsule (20 mg total) by mouth daily. 02/18/22   Agapito Games, MD  amphetamine-dextroamphetamine (ADDERALL XR) 20 MG 24 hr capsule Take 1 capsule (20 mg total) by mouth every morning. 01/19/22   Agapito Games, MD  atorvastatin (LIPITOR) 10 MG tablet Take 1  tablet by mouth once daily 07/30/21   Agapito Games, MD  CAMRESE 0.15-0.03 &0.01 MG tablet  05/24/16   [provider]  fluticasone (FLONASE) 50 MCG/ACT nasal spray Place 2 sprays into both nostrils daily. 01/19/21   [provider]  ibuprofen (ADVIL) 600 MG tablet Take 1 tablet (600 mg total) by mouth every 6 (six) hours as needed. 01/14/22   Domenick Gong, MD  predniSONE (DELTASONE) 20 MG tablet Take 2 tablets (40 mg total) by mouth daily with breakfast. 01/19/22   Agapito Games, MD  promethazine-dextromethorphan (PROMETHAZINE-DM) 6.25-15 MG/5ML syrup Take 5 mLs by mouth 2 (two) times daily as needed for cough. 01/20/22   Trevor Iha, FNP  sertraline (ZOLOFT) 100 MG tablet Take 1.5 tablets (150 mg total) by mouth daily. Patient takes 150 mg daily 10/26/21   Agapito Games, MD    Family History Family History  Problem Relation Age of Onset   Osteoporosis Mother    Constipation Mother    Healthy Father     Social History Social History   Tobacco Use   Smoking status: Never   Smokeless tobacco: Never  Vaping Use   Vaping Use: Never used  Substance Use Topics   Alcohol use: Not Currently   Drug use: No     Allergies   Patient has no  known allergies.   Review of Systems Review of Systems See HPI  Physical Exam Triage Vital Signs ED Triage Vitals  Enc Vitals Group     BP 01/28/22 1920 136/83     Pulse Rate 01/28/22 1920 96     Resp 01/28/22 1920 18     Temp 01/28/22 1920 97.8 F (36.6 C)     Temp Source 01/28/22 1920 Oral     SpO2 01/28/22 1920 98 %     Weight --      Height --      Head Circumference --      Peak Flow --      Pain Score 01/28/22 1922 8     Pain Loc --      Pain Edu? --      Excl. in GC? --    No data found.  Updated Vital Signs BP 136/83 (BP Location: Right Arm)   Pulse 96   Temp 97.8 F (36.6 C) (Oral)   Resp 18   LMP  (LMP Unknown)   SpO2 98%      Physical Exam Constitutional:      General:  She is not in acute distress.    Appearance: She is well-developed.  HENT:     Head: Normocephalic and atraumatic.  Eyes:     Conjunctiva/sclera: Conjunctivae normal.     Pupils: Pupils are equal, round, and reactive to light.  Cardiovascular:     Rate and Rhythm: Normal rate.  Pulmonary:     Effort: Pulmonary effort is normal. No respiratory distress.  Abdominal:     General: There is no distension.     Palpations: Abdomen is soft.  Musculoskeletal:        General: Tenderness present. Normal range of motion.     Cervical back: Normal range of motion.     Left lower leg: Edema present.     Comments: Tenderness over the posterior ankle from the medial malleolus to the lower calf.  Tenderness to deep palpation of calf.  Pain with foot dorsiflexion.  Trace edema of ankle.  No redness or warmth  Skin:    General: Skin is warm and dry.  Neurological:     Mental Status: She is alert.     Gait: Gait abnormal.     Comments: Minimal antalgic gait     UC Treatments / Results  Labs (all labs ordered are listed, but only abnormal results are displayed) Labs Reviewed - No data to display  EKG   Radiology No results found.  Procedures Procedures (including critical care time)  Medications Ordered in UC Medications - No data to display  Initial Impression / Assessment and Plan / UC Course  I have reviewed the triage vital signs and the nursing notes.  Pertinent labs & imaging results that were available during my care of the patient were reviewed by me and considered in my medical decision making (see chart for details).     I feel the likelihood of blood clot is low, however, not 0.  I have concern because she does have an activity, leg swelling, leg pain, and is on birth control pills.  I think the prudent course would be to have her go for an ultrasound tonight.  She was sent to the emergency room Final Clinical Impressions(s) / UC Diagnoses   Final diagnoses:  Left leg  pain  Pain and swelling of lower leg, left     Discharge Instructions  With leg pain and swelling, inactivity and hormone use, I believe you should be evaluated for a blood clot.  GO to ER   ED Prescriptions   None    PDMP not reviewed this encounter.   Eustace Moore, MD 01/28/22 1949

## 2022-01-28 NOTE — ED Triage Notes (Signed)
Pt c/o LT leg pain/cramping x 2 days. Some swelling per pt. Pain starts in heel and shoots up to calf. Says she been lying in bed a lot due to pneumonia. Not sure if related. Ibuprofen prn.

## 2022-01-28 NOTE — ED Triage Notes (Signed)
POV, left ankle and calf pain, dx with pneumonia last week, sts that she thought this was from not walking/cramps from dehydration. Went to UC and they sent her here.

## 2022-01-28 NOTE — ED Notes (Signed)
Dx with left ankle and calf pain, dx with pneumonia last week and thought it was from not walking as much as normal

## 2022-01-29 NOTE — Progress Notes (Addendum)
Pharmacy Note  Attempted phone call to patient x2 to offer DOAC counseling and ensure no medication access issues. Unable to get in touch with patient. Left call back number.   Arturo Morton, PharmD, BCPS Please check AMION for all [facility] contact numbers Clinical Pharmacist 01/29/2022 2:45 PM

## 2022-02-01 ENCOUNTER — Telehealth: Payer: Self-pay | Admitting: General Practice

## 2022-02-01 NOTE — Telephone Encounter (Signed)
Transition Care Management Unsuccessful Follow-up Telephone Call  Date of discharge and from where:  01/28/22 from [facility]  Attempts:  1st Attempt  Reason for unsuccessful TCM follow-up call:  Left voice message

## 2022-02-02 NOTE — Telephone Encounter (Signed)
Transition Care Management Unsuccessful Follow-up Telephone Call  Date of discharge and from where:  01/28/22 from [facility]  Attempts:  2nd Attempt  Reason for unsuccessful TCM follow-up call:  Left voice message

## 2022-02-03 NOTE — Telephone Encounter (Signed)
Transition Care Management Follow-up Telephone Call Date of discharge and from where: 01/28/22 from [facility] How have you been since you were released from the hospital? Doing a little better. But each day has been different. She ended up having a blood clot since her last visit with PCP. She has an appt with PCP on 02/05/22. Any questions or concerns? No  Items Reviewed: Did the pt receive and understand the discharge instructions provided? Yes  Medications obtained and verified? Yes  Other? No  Any new allergies since your discharge? No  Dietary orders reviewed? Yes Do you have support at home? Yes   Home Care and Equipment/Supplies: Were home health services ordered? no  Functional Questionnaire: (I = Independent and D = Dependent) ADLs: I  Bathing/Dressing- I  Meal Prep- I  Eating- I  Maintaining continence- I  Transferring/Ambulation- I  Managing Meds- I  Follow up appointments reviewed:  PCP Hospital f/u appt confirmed? Yes  Scheduled to see Dr. Linford Arnold on 02/05/22. Specialist Hospital f/u appt confirmed? No  Are transportation arrangements needed? No  If their condition worsens, is the pt aware to call PCP or go to the Emergency Dept.? Yes Was the patient provided with contact information for the PCP's office or ED? Yes Was to pt encouraged to call back with questions or concerns? Yes

## 2022-02-05 ENCOUNTER — Ambulatory Visit: Payer: BC Managed Care – PPO | Admitting: Family Medicine

## 2022-02-05 ENCOUNTER — Encounter: Payer: Self-pay | Admitting: Family Medicine

## 2022-02-05 VITALS — BP 138/79 | HR 82 | Resp 18 | Ht 69.0 in | Wt 239.0 lb

## 2022-02-05 DIAGNOSIS — J189 Pneumonia, unspecified organism: Secondary | ICD-10-CM

## 2022-02-05 DIAGNOSIS — I82432 Acute embolism and thrombosis of left popliteal vein: Secondary | ICD-10-CM

## 2022-02-05 DIAGNOSIS — Z86718 Personal history of other venous thrombosis and embolism: Secondary | ICD-10-CM | POA: Insufficient documentation

## 2022-02-05 MED ORDER — RIVAROXABAN 15 MG PO TABS: 15.0000 mg | ORAL_TABLET | Freq: Two times a day (BID) | ORAL | 0 refills | Status: DC

## 2022-02-05 MED ORDER — RIVAROXABAN 20 MG PO TABS: 20.0000 mg | ORAL_TABLET | Freq: Every day | ORAL | 0 refills | Status: DC

## 2022-02-05 NOTE — Assessment & Plan Note (Signed)
Since we do suspect that it was a combination of inactivity while dealing with pneumonia and birth control we will have her discontinue her birth control.  She is going to discuss options with her husband.  She could consider a progesterone only form of birth control including pills, IUD and Nexplanon.  Or look into vasectomy for her husband.  We discussed treatment.  I did verify that she will need a 17-month treatment with the Xarelto so we will send over refills to the pharmacy.  At the end of that we will do some additional testing to rule out other potential causes for hypercoagulation.  F/U in 1 months.

## 2022-02-05 NOTE — Progress Notes (Signed)
Established Patient Office Visit  Subjective   Patient ID: Kristina Harrington, female    DOB: Aug 14, 1981  Age: 41 y.o. MRN: [id number]  Chief Complaint  Patient presents with   Hospital Follow up     HPI  Follow-up ED visit from May 18 for acute DVT.  [name] She was originally seen in the ED on May 4 and treated for sinusitis with Augmentin.  She then saw me about 4 days later on May night and was not improving so I had her complete the Augmentin and added azithromycin.  She continued to have significant chest symptoms and so went back to urgent care on May 10 after getting worse and was diagnosed with right lower lobe pneumonia.  They extended her Augmentin course.  She does feel overall better and did have some questions about possibly getting the pneumonia vaccine.  She returned to urgent care on May 18 for left lower leg swelling and pain.  She was on contraceptives and had been active because of her illness.  Sent to the ED for further evaluation-Doppler confirmed acute DVT.  Started on her Xarelto.Occlusive DVT within the left popliteal vein and at least 1 of the 2 paired posterior tibial veins of the calf.  She says the pain and tightness sensation is getting gradually better.  She is trying to move and stretch throughout the day.  She is still taking her birth control.       ROS    Objective:     BP 138/79   Pulse 82   Resp 18   Ht 5\' 9"  (1.753 m)   Wt 239 lb (108.4 kg)   LMP  (LMP Unknown)   SpO2 97%   BMI 35.29 kg/m    Physical Exam Vitals and nursing note reviewed.  Constitutional:      Appearance: She is well-developed.  HENT:     Head: Normocephalic and atraumatic.  Cardiovascular:     Rate and Rhythm: Normal rate and regular rhythm.     Heart sounds: Normal heart sounds.  Pulmonary:     Effort: Pulmonary effort is normal.     Breath sounds: Normal breath sounds.  Skin:    General: Skin is warm and dry.  Neurological:     Mental Status: She is alert and  oriented to person, place, and time.  Psychiatric:        Behavior: Behavior normal.     No results found for any visits on 02/05/22.    The ASCVD Risk score (Arnett DK, et al., 2019) failed to calculate for the following reasons:   The 2019 ASCVD risk score is only valid for ages 35 to 40    Assessment & Plan:   Problem List Items Addressed This Visit       Cardiovascular and Mediastinum   Acute deep vein thrombosis (DVT) of popliteal vein of left lower extremity (HCC) - Primary    Since we do suspect that it was a combination of inactivity while dealing with pneumonia and birth control we will have her discontinue her birth control.  She is going to discuss options with her husband.  She could consider a progesterone only form of birth control including pills, IUD and Nexplanon.  Or look into vasectomy for her husband.  We discussed treatment.  I did verify that she will need a 51-month treatment with the Xarelto so we will send over refills to the pharmacy.  At the end of that we will do some  additional testing to rule out other potential causes for hypercoagulation.  F/U in 1 months.        Relevant Medications   rivaroxaban (XARELTO) 20 MG TABS tablet   Rivaroxaban (XARELTO) 15 MG TABS tablet   Other Visit Diagnoses     Pneumonia of lower lobe due to infectious organism, unspecified laterality       Relevant Orders   DG Chest 2 View       Right lower lobe pneumonia-they do want her to have a follow-up chest x-ray towards the end of June.  I am get a go ahead and put in a chest x-ray and she can go at her convenience before she leaves for vacation.  We also discussed getting the pneumonia vaccine Prevnar 20 maybe later this summer.  Return in about 4 weeks (around 03/05/2022) for DVT .    [name], MD

## 2022-02-11 ENCOUNTER — Encounter: Payer: Self-pay | Admitting: Family Medicine

## 2022-02-19 ENCOUNTER — Ambulatory Visit (INDEPENDENT_AMBULATORY_CARE_PROVIDER_SITE_OTHER): Payer: BC Managed Care – PPO

## 2022-02-19 DIAGNOSIS — J189 Pneumonia, unspecified organism: Secondary | ICD-10-CM

## 2022-02-22 NOTE — Progress Notes (Signed)
Hi Kristina Harrington, that lung looks better on the right side.  The pneumonia seems to have cleared up.  You do still have a little bit of what we call atelectasis or compression of the lung in the right middle area.  Just continue to work on increased activity exercise and taking deep breaths throughout the day to really help open up the lungs.  But the pneumonia has cleared.

## 2022-03-09 ENCOUNTER — Encounter: Payer: Self-pay | Admitting: Family Medicine

## 2022-03-09 ENCOUNTER — Ambulatory Visit: Payer: BC Managed Care – PPO | Admitting: Family Medicine

## 2022-03-09 VITALS — BP 113/66 | HR 81 | Resp 16 | Ht 69.0 in | Wt 236.0 lb

## 2022-03-09 DIAGNOSIS — Z23 Encounter for immunization: Secondary | ICD-10-CM | POA: Diagnosis not present

## 2022-03-09 DIAGNOSIS — F909 Attention-deficit hyperactivity disorder, unspecified type: Secondary | ICD-10-CM

## 2022-03-09 DIAGNOSIS — Z30013 Encounter for initial prescription of injectable contraceptive: Secondary | ICD-10-CM

## 2022-03-09 DIAGNOSIS — I82432 Acute embolism and thrombosis of left popliteal vein: Secondary | ICD-10-CM

## 2022-03-09 LAB — POCT URINE PREGNANCY: Preg Test, Ur: NEGATIVE

## 2022-03-09 MED ORDER — AMPHETAMINE-DEXTROAMPHET ER 20 MG PO CP24: 20.0000 mg | ORAL_CAPSULE | ORAL | 0 refills | Status: DC

## 2022-03-09 MED ORDER — MEDROXYPROGESTERONE ACETATE 150 MG/ML IM SUSP
150.0000 mg | Freq: Once | INTRAMUSCULAR | Status: AC
  Administered 2022-03-09: 150 mg via INTRAMUSCULAR

## 2022-03-09 NOTE — Progress Notes (Signed)
Established Patient Office Visit  Subjective   Patient ID: Kristina Harrington, female    DOB: 1980-10-01  Age: 41 y.o. MRN: [id number]  Chief Complaint  Patient presents with   Follow up    1 month follow up for DVT of left lower leg. Patient would like to discuss if she should continue with Xarelto    Discuss Vaccine    Patient would like to discuss Pneumonia vaccine.     HPI Follow-up acute DVT of the left lower extremity, she has been on anticoagulation for 1 month.  She has not had any residual pain or swelling.  Infection went to [place] and did a lot of walking.  Still taking her medication.  She actually has not stopped her birth control because she was worried about having heavy bleeding with her.  She has not been sexually active since she had the DVT.  And wants to know what her options would be.  She is also recovering from pneumonia when I last saw her.  Recheck  chest x-ray showed that the pneumonia had cleared.  She actually is feeling much much better.  In regards to mood overall she is doing well she is currently on sertraline she has had just a little bit of irritability but in general thinks she is doing okay.    ROS    Objective:     BP 113/66   Pulse 81   Resp 16   Ht 5\' 9"  (1.753 m)   Wt 236 lb (107 kg)   SpO2 94%   BMI 34.85 kg/m    Physical Exam Vitals and nursing note reviewed.  Constitutional:      Appearance: She is well-developed.  HENT:     Head: Normocephalic and atraumatic.  Cardiovascular:     Rate and Rhythm: Normal rate and regular rhythm.     Heart sounds: Normal heart sounds.  Pulmonary:     Effort: Pulmonary effort is normal.     Breath sounds: Normal breath sounds.  Skin:    General: Skin is warm and dry.  Neurological:     Mental Status: She is alert and oriented to person, place, and time.  Psychiatric:        Behavior: Behavior normal.      Results for orders placed or performed in visit on 03/09/22  POCT urine  pregnancy  Result Value Ref Range   Preg Test, Ur Negative Negative      The ASCVD Risk score (Arnett DK, et al., 2019) failed to calculate for the following reasons:   The 2019 ASCVD risk score is only valid for ages 72 to 13    Assessment & Plan:   Problem List Items Addressed This Visit       Cardiovascular and Mediastinum   Acute deep vein thrombosis (DVT) of popliteal vein of left lower extremity (HCC) - Primary    She is doing really well.  Make sure to complete full 3 months of anticoagulation.  We also discussed an option of doing a Provera which is a progesterone only injection to help control her.  And that way she can stop her birth control which I definitely want her completely off of.  But it could potentially help decrease the incidence of heavy bleeding.  Though we did discuss that she could still have some intermittent spotting.        Other   ADHD    Doing well on current regimen we will go ahead and  send in prescription for August.  She has not picked up the June prescription yet because she did not take it while she was sick.      Relevant Medications   amphetamine-dextroamphetamine (ADDERALL XR) 20 MG 24 hr capsule (Start on 05/07/2022)   Other Visit Diagnoses     Immunization due       Relevant Orders   Pneumococcal conjugate vaccine 20-valent (Prevnar 20) (Completed)   Encounter for initial prescription of injectable contraceptive       Relevant Medications   medroxyPROGESTERone (DEPO-PROVERA) injection 150 mg (Completed)   Other Relevant Orders   POCT urine pregnancy (Completed)       Prevnar 20 given today.  No follow-ups on file.    [name], MD

## 2022-03-11 ENCOUNTER — Encounter: Payer: Self-pay | Admitting: Family Medicine

## 2022-03-18 ENCOUNTER — Encounter: Payer: Self-pay | Admitting: Family Medicine

## 2022-03-18 DIAGNOSIS — F909 Attention-deficit hyperactivity disorder, unspecified type: Secondary | ICD-10-CM

## 2022-03-19 MED ORDER — ADDERALL XR 20 MG PO CP24: 20.0000 mg | ORAL_CAPSULE | ORAL | 0 refills | Status: DC

## 2022-04-26 ENCOUNTER — Ambulatory Visit: Payer: BC Managed Care – PPO | Admitting: Family Medicine

## 2022-04-26 ENCOUNTER — Encounter: Payer: Self-pay | Admitting: Family Medicine

## 2022-04-26 VITALS — BP 113/78 | HR 80 | Ht 69.0 in | Wt 232.0 lb

## 2022-04-26 DIAGNOSIS — I82432 Acute embolism and thrombosis of left popliteal vein: Secondary | ICD-10-CM | POA: Diagnosis not present

## 2022-04-26 DIAGNOSIS — F909 Attention-deficit hyperactivity disorder, unspecified type: Secondary | ICD-10-CM

## 2022-04-26 MED ORDER — AMPHETAMINE-DEXTROAMPHET ER 20 MG PO CP24: 20.0000 mg | ORAL_CAPSULE | Freq: Every day | ORAL | 0 refills | Status: DC

## 2022-04-26 NOTE — Progress Notes (Signed)
   Established Patient Office Visit  Subjective   Patient ID: Kristina Harrington, female    DOB: 1981-05-05  Age: 41 y.o. MRN: [id number]  Chief Complaint  Patient presents with   Follow-up    Adhd     HPI  ADHD - Reports symptoms are well controlled on current regime. Denies any problems with insomnia, chest pain, palpitations, or SOB.  She recently had her.  And had extremely heavy breathing.  She was wearing super tampons and super pads and having to still change them every 45 minutes.  But it has seemed to slow down significantly in the last couple days and seems to be improving.  She has been off of her birth control for a couple of months now.  She should be wrapping up her 36-month treatment of Xarelto for DVT.    ROS    Objective:     BP 113/78   Pulse 80   Ht 5\' 9"  (1.753 m)   Wt 232 lb (105.2 kg)   SpO2 99%   BMI 34.26 kg/m    Physical Exam Vitals and nursing note reviewed.  Constitutional:      Appearance: She is well-developed.  HENT:     Head: Normocephalic and atraumatic.  Cardiovascular:     Rate and Rhythm: Normal rate and regular rhythm.     Heart sounds: Normal heart sounds.  Pulmonary:     Effort: Pulmonary effort is normal.     Breath sounds: Normal breath sounds.  Skin:    General: Skin is warm and dry.  Neurological:     Mental Status: She is alert and oriented to person, place, and time.  Psychiatric:        Behavior: Behavior normal.      No results found for any visits on 04/26/22.    The ASCVD Risk score (Arnett DK, et al., 2019) failed to calculate for the following reasons:   The 2019 ASCVD risk score is only valid for ages 39 to 58    Assessment & Plan:   Problem List Items Addressed This Visit       Cardiovascular and Mediastinum   Acute deep vein thrombosis (DVT) of popliteal vein of left lower extremity (HCC)    He did continue the blood thinner for about 1 more week and will be able to discontinue around the 18th.  We  will have to see if her periods regulate after that since she is now off of oral birth control.  To do hypercoag panel after she comes off of the Xarelto.      Relevant Orders   Venous Thrombosis Hypercoagulability Panel with Reflex     Other   ADHD - Primary    Well controlled. Continue current regimen. Follow up in  6 mo       Relevant Medications   amphetamine-dextroamphetamine (ADDERALL XR) 20 MG 24 hr capsule (Start on 07/07/2022)   amphetamine-dextroamphetamine (ADDERALL XR) 20 MG 24 hr capsule (Start on 06/07/2022)    Return in about 6 months (around 10/27/2022) for ADHD.    10/29/2022, MD

## 2022-04-26 NOTE — Patient Instructions (Addendum)
Ok to stop your blood thinner on 04/30/2022. After you have stopped her blood thinner would like to do some additional blood work just to look for other risk factors for blood clots.  You can schedule that anytime in the  couple weeks after you come off of your blood thinner

## 2022-04-26 NOTE — Assessment & Plan Note (Addendum)
He did continue the blood thinner for about 1 more week and will be able to discontinue around the 18th.  We will have to see if her periods regulate after that since she is now off of oral birth control.  To do hypercoag panel after she comes off of the Xarelto.

## 2022-04-26 NOTE — Assessment & Plan Note (Signed)
Well controlled. Continue current regimen. Follow up in  6 mo  

## 2022-05-03 ENCOUNTER — Other Ambulatory Visit: Payer: BC Managed Care – PPO

## 2022-05-14 ENCOUNTER — Other Ambulatory Visit: Payer: BC Managed Care – PPO

## 2022-05-14 DIAGNOSIS — I82432 Acute embolism and thrombosis of left popliteal vein: Secondary | ICD-10-CM | POA: Diagnosis not present

## 2022-05-18 ENCOUNTER — Encounter: Payer: Self-pay | Admitting: Family Medicine

## 2022-05-20 ENCOUNTER — Ambulatory Visit: Payer: BC Managed Care – PPO | Admitting: Family Medicine

## 2022-05-20 NOTE — Telephone Encounter (Signed)
She has an appointment at 10:30 on 05/21/22. tvt

## 2022-05-21 ENCOUNTER — Ambulatory Visit (INDEPENDENT_AMBULATORY_CARE_PROVIDER_SITE_OTHER): Payer: BC Managed Care – PPO

## 2022-05-21 ENCOUNTER — Ambulatory Visit: Payer: BC Managed Care – PPO | Admitting: Sports Medicine

## 2022-05-21 DIAGNOSIS — J329 Chronic sinusitis, unspecified: Secondary | ICD-10-CM

## 2022-05-21 DIAGNOSIS — J40 Bronchitis, not specified as acute or chronic: Secondary | ICD-10-CM | POA: Diagnosis not present

## 2022-05-21 DIAGNOSIS — R5381 Other malaise: Secondary | ICD-10-CM

## 2022-05-21 DIAGNOSIS — R059 Cough, unspecified: Secondary | ICD-10-CM

## 2022-05-21 DIAGNOSIS — R509 Fever, unspecified: Secondary | ICD-10-CM

## 2022-05-21 LAB — VENOUS THROMBOSIS HYPERCOAGULABILITY PANEL WITH REFLEX
ACTIVATED PROTEIN C RESISTANCE: 4.4 ratio (ref >=2.1)
AntiThromb III Func: 146 % normal — ABNORMAL HIGH (ref 80–135)
Anticardiolipin IgG: <2 GPL-U/mL (ref <20.0)
Anticardiolipin IgM: <2 MPL-U/mL (ref <20.0)
Beta-2 Glyco 1 IgM: <2 U/mL (ref <20.0)
Beta-2 Glyco I IgG: <2 U/mL (ref <20.0)
Factor-VIII Activity: 153 % normal (ref 50–180)
PROTEIN S ANTIGEN, TOTAL: 110 % normal (ref 70–140)
PROTHROMBIN (FACTOR II): NEGATIVE
PTT-LA Screen: 30 s (ref <=40)
Protein C Activity: 87 % normal (ref 70–180)
Protein S Ag, Free: 133 % normal (ref 50–147)
dRVVT: 34 s (ref <=45)

## 2022-05-21 MED ORDER — PREDNISONE 50 MG PO TABS: ORAL_TABLET | ORAL | 0 refills | Status: DC

## 2022-05-21 MED ORDER — AZITHROMYCIN 250 MG PO TABS: ORAL_TABLET | ORAL | 0 refills | Status: DC

## 2022-05-21 NOTE — Progress Notes (Signed)
    Procedures performed today:    None.  Independent interpretation of notes and tests performed by another provider:   None.  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 On exam she is sweating, but her head and neck, oropharyngeal and lung exam are benign. COVID rapid test is negative today, adding azithromycin, prednisone, chest x-ray. Return to see me as needed.  I spent 30 minutes of total time managing this patient today, this includes chart review, face to face, and non-face to face time.  ____________________________________________ Ihor Austin. Benjamin Stain, M.D., ABFM., CAQSM., AME. Primary Care and Sports Medicine [facility]  Adjunct Professor of Family Medicine  [employer]  Restaurant manager, fast food

## 2022-05-21 NOTE — Assessment & Plan Note (Addendum)
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 On exam she is sweating, but her head and neck, oropharyngeal and lung exam are benign. COVID rapid test is negative today, adding azithromycin, prednisone, chest x-ray. Return to see me as needed.

## 2022-05-24 ENCOUNTER — Encounter: Payer: Self-pay | Admitting: Sports Medicine

## 2022-05-24 DIAGNOSIS — E8881 Metabolic syndrome: Secondary | ICD-10-CM | POA: Diagnosis not present

## 2022-05-24 DIAGNOSIS — Z131 Encounter for screening for diabetes mellitus: Secondary | ICD-10-CM | POA: Diagnosis not present

## 2022-05-24 DIAGNOSIS — Z1329 Encounter for screening for other suspected endocrine disorder: Secondary | ICD-10-CM | POA: Diagnosis not present

## 2022-05-24 DIAGNOSIS — R5383 Other fatigue: Secondary | ICD-10-CM | POA: Diagnosis not present

## 2022-05-24 DIAGNOSIS — F909 Attention-deficit hyperactivity disorder, unspecified type: Secondary | ICD-10-CM | POA: Diagnosis not present

## 2022-05-26 ENCOUNTER — Other Ambulatory Visit: Payer: Self-pay | Admitting: Family Medicine

## 2022-05-26 NOTE — Progress Notes (Signed)
Kristina Harrington, labs look good on the hyper coagulation panel.  Nothing underlying that would significantly increase your risk for a future clot.  Great news!

## 2022-06-09 DIAGNOSIS — R5383 Other fatigue: Secondary | ICD-10-CM | POA: Diagnosis not present

## 2022-06-09 DIAGNOSIS — K760 Fatty (change of) liver, not elsewhere classified: Secondary | ICD-10-CM | POA: Diagnosis not present

## 2022-06-15 ENCOUNTER — Encounter: Payer: Self-pay | Admitting: Family Medicine

## 2022-06-15 DIAGNOSIS — F430 Acute stress reaction: Secondary | ICD-10-CM

## 2022-06-15 MED ORDER — SERTRALINE HCL 100 MG PO TABS: 150.0000 mg | ORAL_TABLET | Freq: Every day | ORAL | 1 refills | Status: DC

## 2022-07-19 ENCOUNTER — Encounter: Payer: Self-pay | Admitting: Family Medicine

## 2022-07-19 DIAGNOSIS — F909 Attention-deficit hyperactivity disorder, unspecified type: Secondary | ICD-10-CM

## 2022-07-19 MED ORDER — AMPHETAMINE-DEXTROAMPHET ER 25 MG PO CP24: 25.0000 mg | ORAL_CAPSULE | ORAL | 0 refills | Status: DC

## 2022-07-19 NOTE — Telephone Encounter (Signed)
Meds ordered this encounter  Medications   amphetamine-dextroamphetamine (ADDERALL XR) 25 MG 24 hr capsule    Sig: Take 1 capsule by mouth every morning.    Dispense:  30 capsule    Refill:  0    

## 2022-08-04 DIAGNOSIS — E538 Deficiency of other specified B group vitamins: Secondary | ICD-10-CM | POA: Diagnosis not present

## 2022-08-04 DIAGNOSIS — K760 Fatty (change of) liver, not elsewhere classified: Secondary | ICD-10-CM | POA: Diagnosis not present

## 2022-08-04 DIAGNOSIS — E612 Magnesium deficiency: Secondary | ICD-10-CM | POA: Diagnosis not present

## 2022-08-04 DIAGNOSIS — E559 Vitamin D deficiency, unspecified: Secondary | ICD-10-CM | POA: Diagnosis not present

## 2022-08-04 DIAGNOSIS — E60 Dietary zinc deficiency: Secondary | ICD-10-CM | POA: Diagnosis not present

## 2022-08-09 ENCOUNTER — Ambulatory Visit
Admission: RE | Admit: 2022-08-09 | Discharge: 2022-08-09 | Disposition: A | Discharge status: Home / Self Care | Payer: BC Managed Care – PPO | Source: Ambulatory Visit

## 2022-08-09 ENCOUNTER — Encounter: Payer: Self-pay | Admitting: Family Medicine

## 2022-08-09 ENCOUNTER — Ambulatory Visit (INDEPENDENT_AMBULATORY_CARE_PROVIDER_SITE_OTHER): Payer: BC Managed Care – PPO

## 2022-08-09 VITALS — BP 101/72 | HR 85 | Temp 99.0°F | Resp 20 | Ht 69.0 in | Wt 232.0 lb

## 2022-08-09 DIAGNOSIS — R059 Cough, unspecified: Secondary | ICD-10-CM

## 2022-08-09 MED ORDER — BENZONATATE 200 MG PO CAPS: 200.0000 mg | ORAL_CAPSULE | Freq: Three times a day (TID) | ORAL | 0 refills | Status: AC | PRN

## 2022-08-09 MED ORDER — AZITHROMYCIN 250 MG PO TABS: 250.0000 mg | ORAL_TABLET | Freq: Every day | ORAL | 0 refills | Status: DC

## 2022-08-09 NOTE — ED Triage Notes (Signed)
Pt presents to Urgent Care with c/o worsening cough w/ occasional wheeze and nasal congestion x 4 days. Concerned about pneumonia d/t frequent hx and having a relative w/ recent dx of pneumonia.

## 2022-08-09 NOTE — Discharge Instructions (Addendum)
Advised patient to take medication as directed with food to completion.  Advised may take Tessalon Perles daily or as needed for cough.  Encouraged patient to increase daily water intake to 64 ounces per day while taking these medications.  Advised if symptoms worsen and/or unresolved please follow-up PCP or here for further evaluation.

## 2022-08-09 NOTE — ED Provider Notes (Signed)
[facility]    CSN: [id number] Arrival date & time: 08/09/22  1646      History   Chief Complaint Chief Complaint  Patient presents with   1700 appt   Cough   Nasal Congestion    HPI Kristina Harrington is a 41 y.o. female.   HPI 41 year old female presents with worsening cough and occasional wheeze with nasal congestion for 4 days.  Patient is concerned about pneumonia due to recent history of family member have a pneumonia.  PMH significant for obesity, ADHD, and HLD.  Past Medical History:  Diagnosis Date   ADHD    Allergic rhinitis 12/11/2014   Dymista, singulair, claritin.     Hyperlipidemia 08/02/2016   Lumbar degenerative disc disease 09/20/2013    Patient Active Problem List   Diagnosis Date Noted   Acute deep vein thrombosis (DVT) of popliteal vein of left lower extremity (HCC) 02/05/2022   ADHD 11/06/2020   Acute stress reaction 08/02/2016   Hyperlipidemia 08/02/2016   Rupture of anterior cruciate ligament of left knee 03/28/2015   Allergic rhinitis 12/11/2014   Sinobronchitis 12/11/2014   Iliotibial band syndrome of right side 11/02/2013   Lumbar degenerative disc disease 09/20/2013    Past Surgical History:  Procedure Laterality Date   ARTHROSCOPIC REPAIR ACL  03/2015   KNEE SURGERY Left    ACL    OB History   No obstetric history on file.      Home Medications    Prior to Admission medications   Medication Sig Start Date End Date Taking? Authorizing Provider  azithromycin (ZITHROMAX) 250 MG tablet Take 1 tablet (250 mg total) by mouth daily. Take first 2 tablets together, then 1 every day until finished. 08/09/22  Yes Trevor Iha, FNP  benzonatate (TESSALON) 200 MG capsule Take 1 capsule (200 mg total) by mouth 3 (three) times daily as needed for up to 7 days. 08/09/22 08/16/22 Yes Trevor Iha, FNP  fexofenadine (ALLEGRA) 60 MG tablet Take 60 mg by mouth 2 (two) times daily.   Yes [provider]   amphetamine-dextroamphetamine (ADDERALL XR) 20 MG 24 hr capsule Take 1 capsule (20 mg total) by mouth daily. 07/07/22   Agapito Games, MD  amphetamine-dextroamphetamine (ADDERALL XR) 20 MG 24 hr capsule Take 1 capsule (20 mg total) by mouth daily. 06/07/22   Agapito Games, MD  amphetamine-dextroamphetamine (ADDERALL XR) 25 MG 24 hr capsule Take 1 capsule by mouth every morning. 07/19/22   Agapito Games, MD  atorvastatin (LIPITOR) 10 MG tablet Take 1 tablet by mouth once daily 07/30/21   Agapito Games, MD  fluticasone [facility]) 50 MCG/ACT nasal spray Place 2 sprays into both nostrils daily. 01/19/21   [provider]  ibuprofen (ADVIL) 600 MG tablet Take 1 tablet (600 mg total) by mouth every 6 (six) hours as needed. 01/14/22   Domenick Gong, MD  sertraline (ZOLOFT) 100 MG tablet Take 1.5 tablets (150 mg total) by mouth daily. Patient takes 150 mg daily 06/15/22   Agapito Games, MD    Family History Family History  Problem Relation Age of Onset   Osteoporosis Mother    Constipation Mother    Healthy Father     Social History Social History   Tobacco Use   Smoking status: Never   Smokeless tobacco: Never  Vaping Use   Vaping Use: Never used  Substance Use Topics   Alcohol use: Not Currently   Drug use: No     Allergies  Patient has no known allergies.   Review of Systems Review of Systems  HENT:  Positive for congestion.   Respiratory:  Positive for cough.   All other systems reviewed and are negative.    Physical Exam Triage Vital Signs ED Triage Vitals [08/09/22 1711]  Enc Vitals Group     BP      Pulse      Resp      Temp      Temp src      SpO2      Weight 232 lb (105.2 kg)     Height 5\' 9"  (1.753 m)     Head Circumference      Peak Flow      Pain Score 3     Pain Loc      Pain Edu?      Excl. in GC?    No data found.  Updated Vital Signs BP 101/72 (BP Location: Right Arm)   Pulse 85   Temp 99 F  (37.2 C) (Oral)   Resp 20   Ht 5\' 9"  (1.753 m)   Wt 232 lb (105.2 kg)   LMP 07/31/2022 (Exact Date)   SpO2 98%   BMI 34.26 kg/m      Physical Exam Vitals and nursing note reviewed.  Constitutional:      Appearance: Normal appearance. She is normal weight.  HENT:     Head: Normocephalic and atraumatic.     Right Ear: Tympanic membrane, ear canal and external ear normal.     Left Ear: Tympanic membrane, ear canal and external ear normal.     Nose: Nose normal.     Mouth/Throat:     Mouth: Mucous membranes are moist.     Pharynx: Oropharynx is clear.  Eyes:     Extraocular Movements: Extraocular movements intact.     Conjunctiva/sclera: Conjunctivae normal.     Pupils: Pupils are equal, round, and reactive to light.  Cardiovascular:     Rate and Rhythm: Normal rate and regular rhythm.     Pulses: Normal pulses.     Heart sounds: Normal heart sounds. No murmur heard. Pulmonary:     Effort: Pulmonary effort is normal.     Breath sounds: Normal breath sounds. No wheezing, rhonchi or rales.     Comments: Infrequent nonproductive cough noted on exam Musculoskeletal:        General: Normal range of motion.     Cervical back: Normal range of motion and neck supple.  Skin:    General: Skin is warm and dry.  Neurological:     General: No focal deficit present.     Mental Status: She is alert and oriented to person, place, and time.      UC Treatments / Results  Labs (all labs ordered are listed, but only abnormal results are displayed) Labs Reviewed - No data to display  EKG   Radiology DG Chest 2 View  Result Date: 08/09/2022 CLINICAL DATA:  Cough EXAM: CHEST - 2 VIEW COMPARISON:  05/21/2022 FINDINGS: The heart size and mediastinal contours are within normal limits. Both lungs are clear. The visualized skeletal structures are unremarkable. IMPRESSION: No active cardiopulmonary disease. Electronically Signed   By: 08/11/2022 D.O.   On: 08/09/2022 17:35     Procedures Procedures (including critical care time)  Medications Ordered in UC Medications - No data to display  Initial Impression / Assessment and Plan / UC Course  I have reviewed the triage vital signs  and the nursing notes.  Pertinent labs & imaging results that were available during my care of the patient were reviewed by me and considered in my medical decision making (see chart for details).     MDM: Cough-Rx'd Zithromax, Tessalon. Advised patient to take medication as directed with food to completion.  Advised may take Tessalon Perles daily or as needed for cough.  Encouraged patient to increase daily water intake to 64 ounces per day while taking these medications.  Advised if symptoms worsen and/or unresolved please follow-up PCP or here for further evaluation.  Patient discharged home, hemodynamically stable. Final Clinical Impressions(s) / UC Diagnoses   Final diagnoses:  Cough, unspecified type     Discharge Instructions      Advised patient to take medication as directed with food to completion.  Advised may take Tessalon Perles daily or as needed for cough.  Encouraged patient to increase daily water intake to 64 ounces per day while taking these medications.  Advised if symptoms worsen and/or unresolved please follow-up PCP or here for further evaluation.     ED Prescriptions     Medication Sig Dispense Auth. Provider   azithromycin (ZITHROMAX) 250 MG tablet Take 1 tablet (250 mg total) by mouth daily. Take first 2 tablets together, then 1 every day until finished. 6 tablet Trevor Iha, FNP   benzonatate (TESSALON) 200 MG capsule Take 1 capsule (200 mg total) by mouth 3 (three) times daily as needed for up to 7 days. 40 capsule Trevor Iha, FNP      PDMP not reviewed this encounter.   Trevor Iha, FNP 08/09/22 1755

## 2022-08-10 ENCOUNTER — Ambulatory Visit: Payer: BC Managed Care – PPO | Admitting: Family Medicine

## 2022-08-14 LAB — BASIC METABOLIC PANEL
BUN: 8 (ref 4–21)
Chloride: 104 (ref 99–108)
Creatinine: 0.8 (ref 0.5–1.1)
Glucose: 100
Potassium: 4.8 mEq/L (ref 3.5–5.1)
Sodium: 141 (ref 137–147)

## 2022-08-14 LAB — VITAMIN B12: Vitamin B-12: >2000

## 2022-08-14 LAB — HEPATIC FUNCTION PANEL
ALT: 47 U/L — AB (ref 7–35)
AST: 31 (ref 13–35)
Alkaline Phosphatase: 183 — AB (ref 25–125)

## 2022-08-14 LAB — MAGNESIUM, RBC: Magnesium, RBC: 6.6

## 2022-08-14 LAB — VITAMIN D 25 HYDROXY (VIT D DEFICIENCY, FRACTURES): Vitamin D, 25-Hydroxy: 35.8

## 2022-08-14 LAB — CBC AND DIFFERENTIAL
Hemoglobin: 14.9 (ref 12.0–16.0)
Platelets: 237 10*3/uL (ref 150–400)
WBC: 7.3

## 2022-08-14 LAB — COMPREHENSIVE METABOLIC PANEL
Calcium: 9.8 (ref 8.7–10.7)
eGFR: 93

## 2022-08-14 LAB — CBC: RBC: 4.51 (ref 3.87–5.11)

## 2022-08-14 LAB — GAMMA GT: GGT: 31

## 2022-08-24 ENCOUNTER — Encounter: Payer: Self-pay | Admitting: Family Medicine

## 2022-08-24 DIAGNOSIS — F909 Attention-deficit hyperactivity disorder, unspecified type: Secondary | ICD-10-CM

## 2022-08-24 MED ORDER — AMPHETAMINE-DEXTROAMPHET ER 20 MG PO CP24: 20.0000 mg | ORAL_CAPSULE | Freq: Every day | ORAL | 0 refills | Status: DC

## 2022-08-24 NOTE — Telephone Encounter (Signed)
Meds ordered this encounter  Medications  . amphetamine-dextroamphetamine (ADDERALL XR) 20 MG 24 hr capsule    Sig: Take 1 capsule (20 mg total) by mouth daily.    Dispense:  30 capsule    Refill:  0    

## 2022-08-24 NOTE — Telephone Encounter (Signed)
L.O.V: 05/21/22  N.O.V: Not scheduled   L.R.F: 07/19/22 Adderall XR 25 mg 30 cap 0 refill

## 2022-09-02 DIAGNOSIS — R5383 Other fatigue: Secondary | ICD-10-CM | POA: Diagnosis not present

## 2022-09-02 DIAGNOSIS — F909 Attention-deficit hyperactivity disorder, unspecified type: Secondary | ICD-10-CM | POA: Diagnosis not present

## 2022-09-02 DIAGNOSIS — K760 Fatty (change of) liver, not elsewhere classified: Secondary | ICD-10-CM | POA: Diagnosis not present

## 2022-09-02 DIAGNOSIS — Z131 Encounter for screening for diabetes mellitus: Secondary | ICD-10-CM | POA: Diagnosis not present

## 2022-09-07 ENCOUNTER — Other Ambulatory Visit: Payer: Self-pay | Admitting: Family Medicine

## 2022-09-07 DIAGNOSIS — E785 Hyperlipidemia, unspecified: Secondary | ICD-10-CM

## 2022-10-08 ENCOUNTER — Other Ambulatory Visit: Payer: Self-pay | Admitting: Family Medicine

## 2022-10-08 DIAGNOSIS — F909 Attention-deficit hyperactivity disorder, unspecified type: Secondary | ICD-10-CM

## 2022-10-08 MED ORDER — AMPHETAMINE-DEXTROAMPHET ER 25 MG PO CP24: 25.0000 mg | ORAL_CAPSULE | ORAL | 0 refills | Status: DC

## 2022-10-11 ENCOUNTER — Other Ambulatory Visit: Payer: Self-pay | Admitting: Family Medicine

## 2022-10-11 DIAGNOSIS — E785 Hyperlipidemia, unspecified: Secondary | ICD-10-CM

## 2022-11-15 ENCOUNTER — Other Ambulatory Visit: Payer: Self-pay | Admitting: Family Medicine

## 2022-11-15 DIAGNOSIS — E785 Hyperlipidemia, unspecified: Secondary | ICD-10-CM

## 2022-11-15 MED ORDER — ATORVASTATIN CALCIUM 10 MG PO TABS: 10.0000 mg | ORAL_TABLET | Freq: Every day | ORAL | 0 refills | Status: DC

## 2022-11-22 ENCOUNTER — Other Ambulatory Visit: Payer: Self-pay | Admitting: Family Medicine

## 2022-11-22 ENCOUNTER — Encounter: Payer: Self-pay | Admitting: Family Medicine

## 2022-11-22 DIAGNOSIS — F909 Attention-deficit hyperactivity disorder, unspecified type: Secondary | ICD-10-CM

## 2022-11-22 MED ORDER — AMPHETAMINE-DEXTROAMPHET ER 25 MG PO CP24: 25.0000 mg | ORAL_CAPSULE | ORAL | 0 refills | Status: DC

## 2022-12-06 ENCOUNTER — Ambulatory Visit: Payer: BC Managed Care – PPO | Admitting: Family Medicine

## 2022-12-06 VITALS — BP 128/60 | HR 89 | Ht 69.0 in | Wt 227.0 lb

## 2022-12-06 DIAGNOSIS — E785 Hyperlipidemia, unspecified: Secondary | ICD-10-CM | POA: Diagnosis not present

## 2022-12-06 DIAGNOSIS — F909 Attention-deficit hyperactivity disorder, unspecified type: Secondary | ICD-10-CM | POA: Diagnosis not present

## 2022-12-06 DIAGNOSIS — F430 Acute stress reaction: Secondary | ICD-10-CM

## 2022-12-06 MED ORDER — ATORVASTATIN CALCIUM 10 MG PO TABS: 10.0000 mg | ORAL_TABLET | Freq: Every day | ORAL | 3 refills | Status: DC

## 2022-12-06 MED ORDER — AMPHETAMINE-DEXTROAMPHET ER 25 MG PO CP24: 25.0000 mg | ORAL_CAPSULE | ORAL | 0 refills | Status: DC

## 2022-12-06 MED ORDER — SERTRALINE HCL 100 MG PO TABS: 150.0000 mg | ORAL_TABLET | Freq: Every day | ORAL | 1 refills | Status: DC

## 2022-12-06 NOTE — Assessment & Plan Note (Signed)
Will look through labs hopefully they were able to check a lipid since she is on the statin as well as liver function just to make sure that everything looks good.  Those abstracted into the chart but if not we will call her back and let her know.

## 2022-12-06 NOTE — Assessment & Plan Note (Signed)
She does feel like the 25 mg has been more helpful than 20 mg still having some issues with not interrupting others and being a little bit more patient with her listening.  We did discuss that we could consider bumping up the dose if she would like at some point but for now we will continue with 25 mg.  She can always reach out if she feels like we might need to make a change.

## 2022-12-06 NOTE — Progress Notes (Signed)
Established Patient Office Visit  Subjective   Patient ID: Kristina Harrington, female    DOB: [birth year]  Age: 42 y.o. MRN: [id number]  Chief Complaint  Patient presents with   ADHD    HPI   ADHD - Reports symptoms are well controlled on current regime. Denies any problems with insomnia, chest pain, palpitations, or SOB.    Follow-up acute stress reaction-currently on sertraline 150mg  she has been doing well with her current regimen and is happy with it does not want to make any changes today.  Sleep is okay.  She also goes to [facility] and had some blood work done she brought a copy to drop off today.  Hyperlipidemia - tolerating stating well with no myalgias or significant side effects.  Lab Results  Component Value Date   CHOL 172 04/27/2021   HDL 46 (L) 04/27/2021   LDLCALC 101 (H) 04/27/2021   TRIG 157 (H) 04/27/2021   CHOLHDL 3.7 04/27/2021        ROS    Objective:     BP 128/60   Pulse 89   Ht 5\' 9"  (1.753 m)   Wt 227 lb (103 kg)   SpO2 98%   BMI 33.52 kg/m    Physical Exam Vitals and nursing note reviewed.  Constitutional:      Appearance: She is well-developed.  HENT:     Head: Normocephalic and atraumatic.  Cardiovascular:     Rate and Rhythm: Normal rate and regular rhythm.     Heart sounds: Normal heart sounds.  Pulmonary:     Effort: Pulmonary effort is normal.     Breath sounds: Normal breath sounds.  Skin:    General: Skin is warm and dry.  Neurological:     Mental Status: She is alert and oriented to person, place, and time.  Psychiatric:        Behavior: Behavior normal.      No results found for any visits on 12/06/22.    The ASCVD Risk score (Arnett DK, et al., 2019) failed to calculate for the following reasons:   The 2019 ASCVD risk score is only valid for ages 23 to 23    Assessment & Plan:   Problem List Items Addressed This Visit       Other   Hyperlipidemia    Will look through labs hopefully they  were able to check a lipid since she is on the statin as well as liver function just to make sure that everything looks good.  Those abstracted into the chart but if not we will call her back and let her know.      Relevant Medications   atorvastatin (LIPITOR) 10 MG tablet   ADHD - Primary    She does feel like the 25 mg has been more helpful than 20 mg still having some issues with not interrupting others and being a little bit more patient with her listening.  We did discuss that we could consider bumping up the dose if she would like at some point but for now we will continue with 25 mg.  She can always reach out if she feels like we might need to make a change.      Relevant Medications   amphetamine-dextroamphetamine (ADDERALL XR) 25 MG 24 hr capsule   amphetamine-dextroamphetamine (ADDERALL XR) 25 MG 24 hr capsule (Start on 01/05/2023)   amphetamine-dextroamphetamine (ADDERALL XR) 25 MG 24 hr capsule (Start on 02/02/2023)   Acute stress reaction  Continue current dose of sertraline.  Happy with current regimen.  Follow-up in 6 months.      Relevant Medications   sertraline (ZOLOFT) 100 MG tablet    Return in about 6 months (around 06/08/2023) for ADD.    Beatrice Lecher, MD

## 2022-12-06 NOTE — Assessment & Plan Note (Signed)
Continue current dose of sertraline.  Happy with current regimen.  Follow-up in 6 months.

## 2022-12-27 ENCOUNTER — Encounter: Payer: Self-pay | Admitting: Family Medicine

## 2022-12-27 DIAGNOSIS — F909 Attention-deficit hyperactivity disorder, unspecified type: Secondary | ICD-10-CM

## 2022-12-27 MED ORDER — AMPHETAMINE-DEXTROAMPHET ER 25 MG PO CP24: 25.0000 mg | ORAL_CAPSULE | ORAL | 0 refills | Status: DC

## 2022-12-29 ENCOUNTER — Encounter: Payer: Self-pay | Admitting: Family Medicine

## 2022-12-30 ENCOUNTER — Telehealth: Payer: Self-pay

## 2022-12-30 NOTE — Telephone Encounter (Addendum)
Initiated Prior authorization [id number] XR 25MG  er capsules Via: Covermymeds Case/Key:[id number] Status: approved as of 12/30/22 Reason:Coverage Ends on: 01/08/2024 12:00:00 AM. Notified Pt via: Mychart

## 2023-01-05 ENCOUNTER — Telehealth: Payer: Self-pay | Admitting: Family Medicine

## 2023-01-05 ENCOUNTER — Encounter: Payer: Self-pay | Admitting: Family Medicine

## 2023-01-05 NOTE — Telephone Encounter (Signed)
[facility] pharmacy called stating a PA is required for the patients prescription of amphetamine-dextroamphetamine (ADDERALL XR) 25 MG 24 hr capsule [[id number]]

## 2023-01-07 ENCOUNTER — Encounter: Payer: Self-pay | Admitting: Family Medicine

## 2023-01-31 DIAGNOSIS — Z131 Encounter for screening for diabetes mellitus: Secondary | ICD-10-CM | POA: Diagnosis not present

## 2023-01-31 DIAGNOSIS — E538 Deficiency of other specified B group vitamins: Secondary | ICD-10-CM | POA: Diagnosis not present

## 2023-01-31 DIAGNOSIS — R5383 Other fatigue: Secondary | ICD-10-CM | POA: Diagnosis not present

## 2023-01-31 DIAGNOSIS — E612 Magnesium deficiency: Secondary | ICD-10-CM | POA: Diagnosis not present

## 2023-01-31 DIAGNOSIS — K760 Fatty (change of) liver, not elsewhere classified: Secondary | ICD-10-CM | POA: Diagnosis not present

## 2023-01-31 DIAGNOSIS — N959 Unspecified menopausal and perimenopausal disorder: Secondary | ICD-10-CM | POA: Diagnosis not present

## 2023-01-31 DIAGNOSIS — E559 Vitamin D deficiency, unspecified: Secondary | ICD-10-CM | POA: Diagnosis not present

## 2023-01-31 DIAGNOSIS — Z1329 Encounter for screening for other suspected endocrine disorder: Secondary | ICD-10-CM | POA: Diagnosis not present

## 2023-01-31 DIAGNOSIS — E60 Dietary zinc deficiency: Secondary | ICD-10-CM | POA: Diagnosis not present

## 2023-02-14 DIAGNOSIS — K760 Fatty (change of) liver, not elsewhere classified: Secondary | ICD-10-CM | POA: Diagnosis not present

## 2023-02-14 DIAGNOSIS — F909 Attention-deficit hyperactivity disorder, unspecified type: Secondary | ICD-10-CM | POA: Diagnosis not present

## 2023-02-14 DIAGNOSIS — R5383 Other fatigue: Secondary | ICD-10-CM | POA: Diagnosis not present

## 2023-02-14 DIAGNOSIS — Z131 Encounter for screening for diabetes mellitus: Secondary | ICD-10-CM | POA: Diagnosis not present

## 2023-03-03 ENCOUNTER — Encounter: Payer: Self-pay | Admitting: Family Medicine

## 2023-03-04 ENCOUNTER — Other Ambulatory Visit: Payer: Self-pay | Admitting: Family Medicine

## 2023-03-04 ENCOUNTER — Encounter: Payer: Self-pay | Admitting: Family Medicine

## 2023-03-04 DIAGNOSIS — F909 Attention-deficit hyperactivity disorder, unspecified type: Secondary | ICD-10-CM

## 2023-03-04 MED ORDER — AMPHETAMINE-DEXTROAMPHET ER 25 MG PO CP24
25.0000 mg | ORAL_CAPSULE | ORAL | 0 refills | Status: DC
Start: 2023-03-04 — End: 2023-03-07

## 2023-03-07 ENCOUNTER — Encounter: Payer: Self-pay | Admitting: Family Medicine

## 2023-03-07 DIAGNOSIS — F909 Attention-deficit hyperactivity disorder, unspecified type: Secondary | ICD-10-CM

## 2023-03-07 MED ORDER — AMPHETAMINE-DEXTROAMPHET ER 25 MG PO CP24: 25.0000 mg | ORAL_CAPSULE | ORAL | 0 refills | Status: DC

## 2023-03-07 NOTE — Telephone Encounter (Signed)
Meds ordered this encounter  Medications   amphetamine-dextroamphetamine (ADDERALL XR) 25 MG 24 hr capsule    Sig: Take 1 capsule by mouth every morning.    Dispense:  30 capsule    Refill:  0    

## 2023-05-03 IMAGING — DX DG CHEST 2V
2 series · 2 of 2 positions shown · non-contrast
Comparison: None.

CLINICAL DATA: Cough and fatigue.  Sinus infection

EXAM:
CHEST - 2 VIEW

[chest pa]
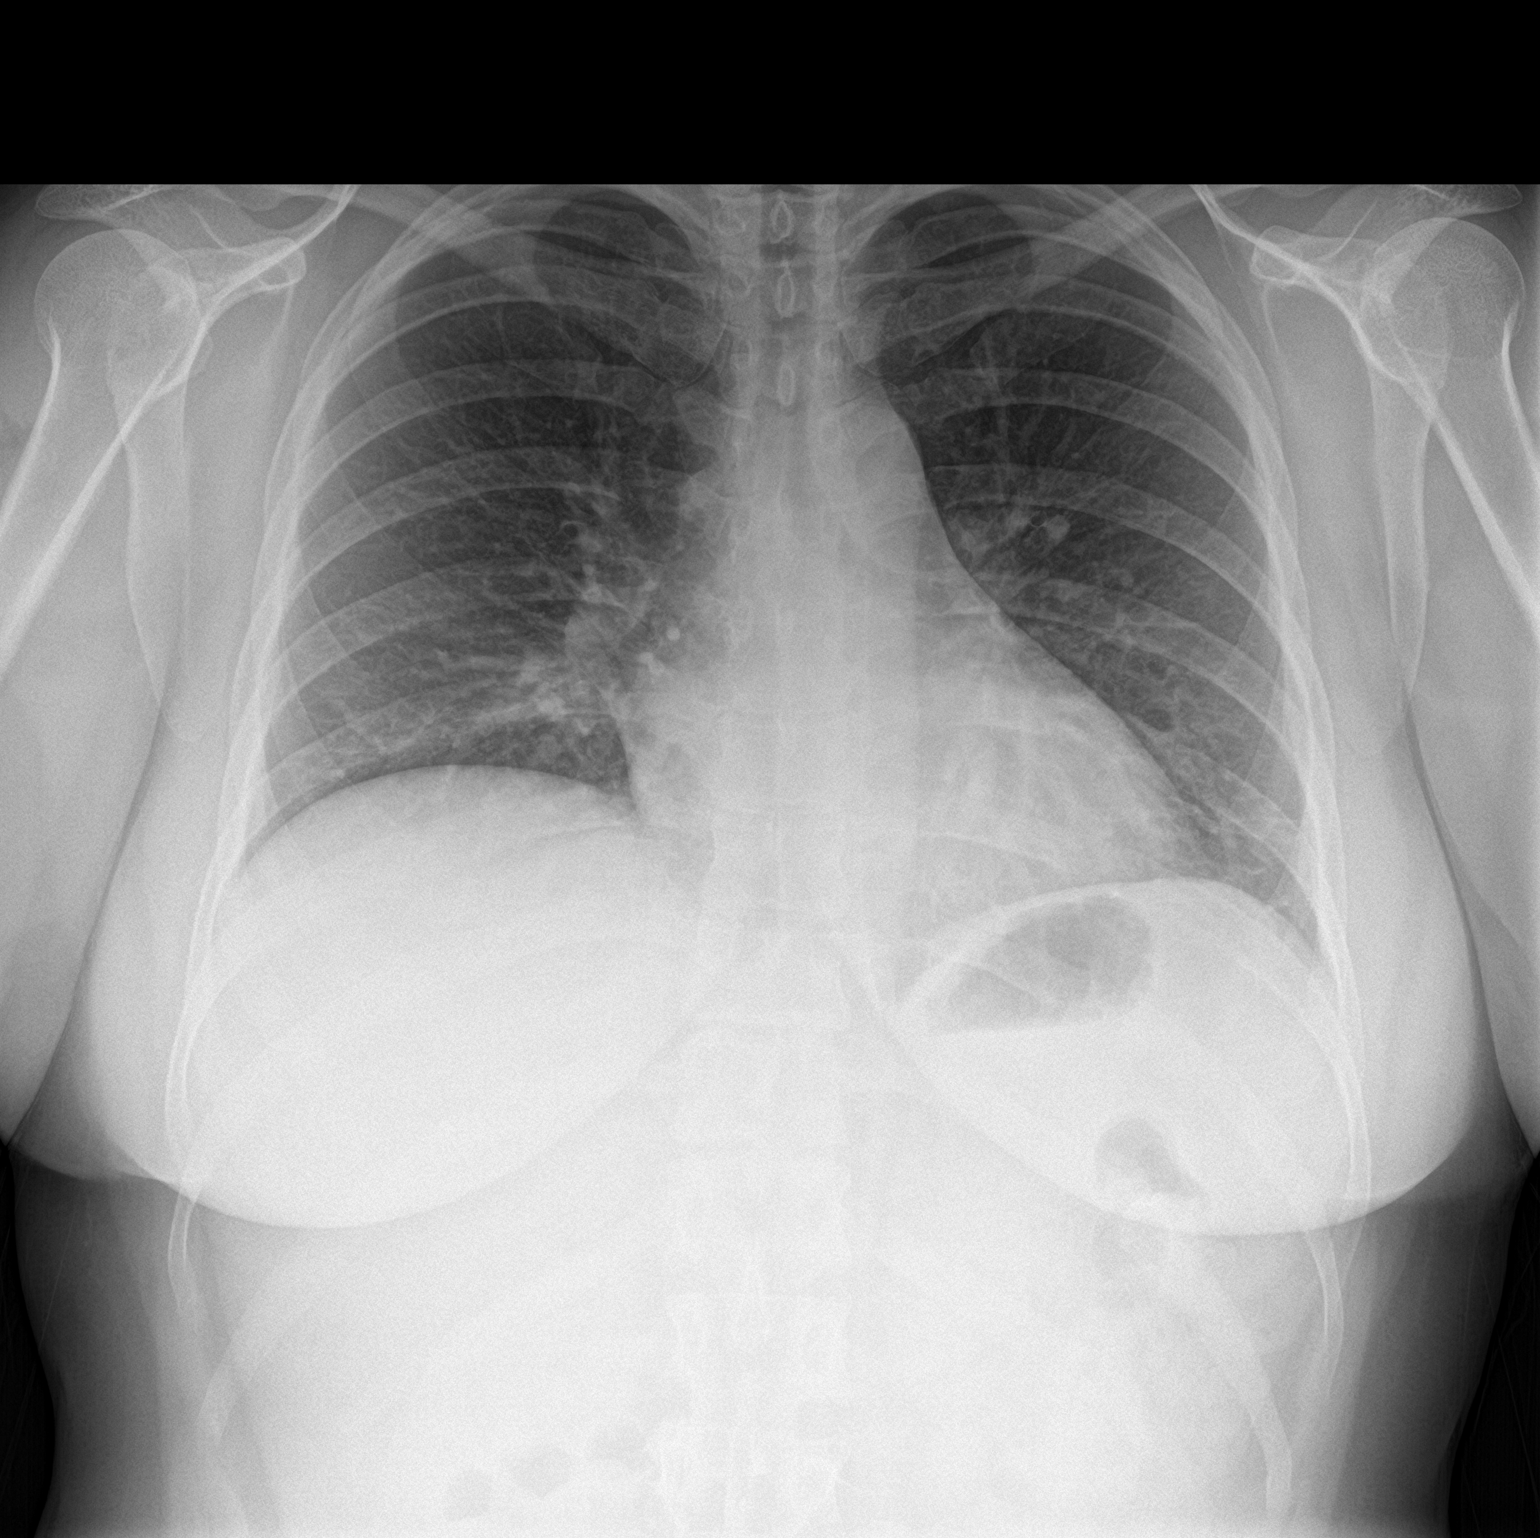

[chest lat]
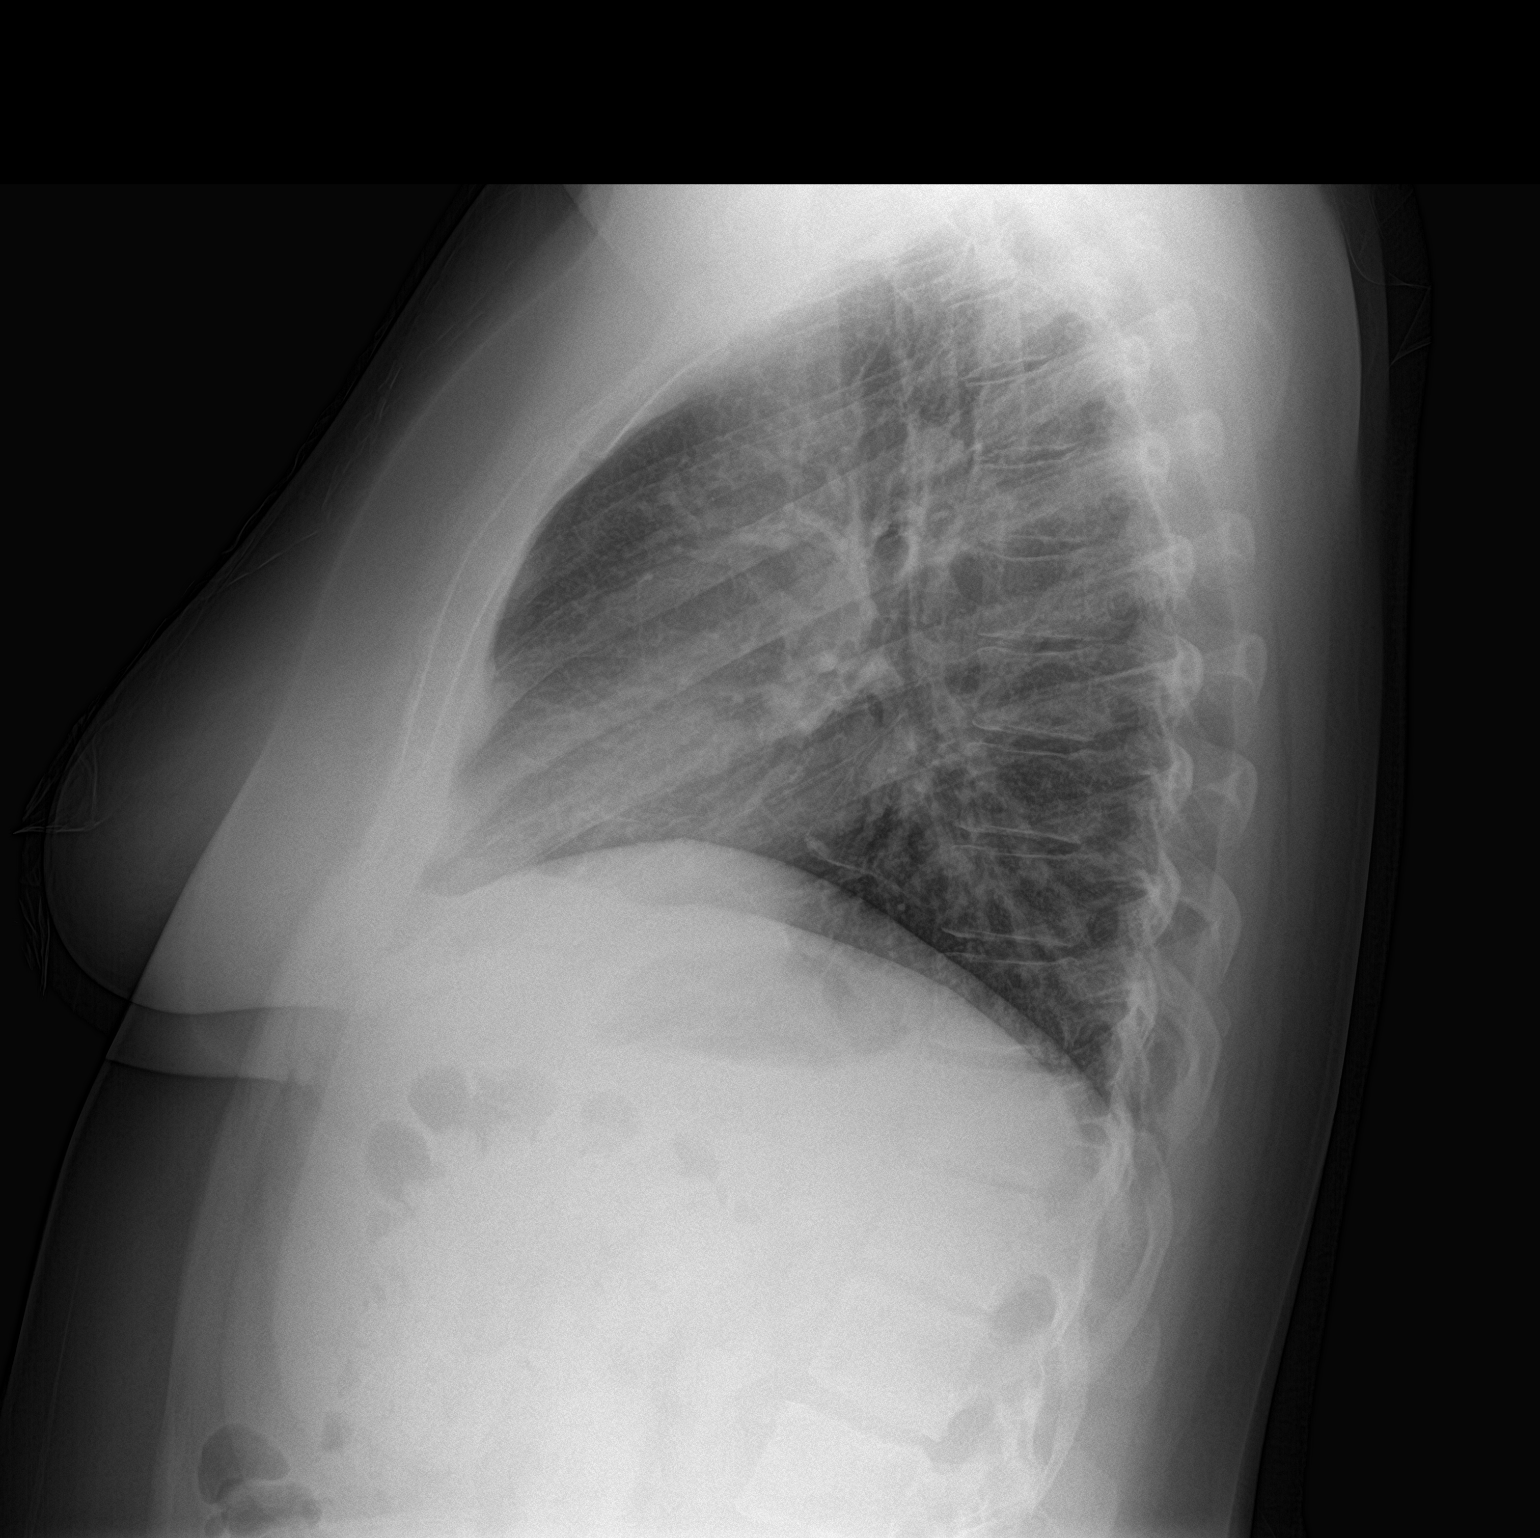

[2 of 2 positions shown; findings below may reference images not displayed]

FINDINGS: Normal mediastinum and cardiac silhouette. Normal pulmonary
vasculature. No evidence of effusion, infiltrate, or pneumothorax.
No acute bony abnormality.
IMPRESSION: No acute cardiopulmonary process.

## 2023-05-30 ENCOUNTER — Other Ambulatory Visit: Payer: Self-pay | Admitting: Family Medicine

## 2023-05-30 DIAGNOSIS — F909 Attention-deficit hyperactivity disorder, unspecified type: Secondary | ICD-10-CM

## 2023-05-31 MED ORDER — AMPHETAMINE-DEXTROAMPHET ER 25 MG PO CP24: 25.0000 mg | ORAL_CAPSULE | ORAL | 0 refills | Status: DC

## 2023-06-13 ENCOUNTER — Ambulatory Visit (INDEPENDENT_AMBULATORY_CARE_PROVIDER_SITE_OTHER): Payer: Self-pay | Admitting: Family Medicine

## 2023-06-13 ENCOUNTER — Encounter: Payer: Self-pay | Admitting: Family Medicine

## 2023-06-13 VITALS — BP 114/53 | HR 72 | Ht 69.0 in | Wt 219.0 lb

## 2023-06-13 DIAGNOSIS — F909 Attention-deficit hyperactivity disorder, unspecified type: Secondary | ICD-10-CM

## 2023-06-13 DIAGNOSIS — F430 Acute stress reaction: Secondary | ICD-10-CM

## 2023-06-13 MED ORDER — AMPHETAMINE-DEXTROAMPHET ER 25 MG PO CP24: 25.0000 mg | ORAL_CAPSULE | ORAL | 0 refills | Status: DC

## 2023-06-13 MED ORDER — SERTRALINE HCL 100 MG PO TABS: 150.0000 mg | ORAL_TABLET | Freq: Every day | ORAL | 1 refills | Status: DC

## 2023-06-13 NOTE — Assessment & Plan Note (Signed)
Continue current regimnen. Doing really well.   Flowsheet Row Office Visit from 12/06/2022 in [facility] at [facility]  PHQ-9 Total Score 4         12/06/2022    3:06 PM 02/17/2021    3:31 PM 05/12/2020    4:00 PM 03/29/2019    8:48 AM  GAD 7 : Generalized Anxiety Score  Nervous, Anxious, on Edge 0 1 1 2   Control/stop worrying 0 0 0 1  Worry too much - different things 0 1 0 2  Trouble relaxing 0 2 2 1   Restless  0 1 1  Easily annoyed or irritable 1 1 2 2   Afraid - awful might happen 0 0 0 1  Total GAD 7 Score  5 6 10   Anxiety Difficulty Not difficult at all Somewhat difficult Somewhat difficult Somewhat difficult

## 2023-06-13 NOTE — Progress Notes (Signed)
   Established Patient Office Visit  Subjective   Patient ID: Alicea Wente, female    DOB: 03-29-1981  Age: 42 y.o. MRN: [id number]  Chief Complaint  Patient presents with   ADHD    HPI  ADHD - Reports symptoms are well controlled on current regime. Denies any problems with insomnia, chest pain, palpitations, or SOB.  They only had 20 tabs this last refill.   She is doing well. Work has been some stressful.       ROS    Objective:     BP (!) 114/53   Pulse 72   Ht 5\' 9"  (1.753 m)   Wt 219 lb (99.3 kg)   SpO2 97%   BMI 32.34 kg/m    Physical Exam Vitals and nursing note reviewed.  Constitutional:      Appearance: Normal appearance.  HENT:     Head: Normocephalic and atraumatic.  Eyes:     Conjunctiva/sclera: Conjunctivae normal.  Cardiovascular:     Rate and Rhythm: Normal rate and regular rhythm.  Pulmonary:     Effort: Pulmonary effort is normal.     Breath sounds: Normal breath sounds.  Skin:    General: Skin is warm and dry.  Neurological:     Mental Status: She is alert.  Psychiatric:        Mood and Affect: Mood normal.      No results found for any visits on 06/13/23.    The 10-year ASCVD risk score (Arnett DK, et al., 2019) is: 0.5%    Assessment & Plan:   Problem List Items Addressed This Visit       Other   ADHD - Primary    Well controlled. Continue current regimen. Follow up in  71mo       Relevant Medications   amphetamine-dextroamphetamine (ADDERALL XR) 25 MG 24 hr capsule (Start on 06/14/2023)   amphetamine-dextroamphetamine (ADDERALL XR) 25 MG 24 hr capsule (Start on 07/14/2023)   amphetamine-dextroamphetamine (ADDERALL XR) 25 MG 24 hr capsule (Start on 08/13/2023)   Acute stress reaction    Continue current regimnen. Doing really well.   Flowsheet Row Office Visit from 12/06/2022 in [facility] at [facility]  PHQ-9 Total Score 4         12/06/2022    3:06 PM 02/17/2021     3:31 PM 05/12/2020    4:00 PM 03/29/2019    8:48 AM  GAD 7 : Generalized Anxiety Score  Nervous, Anxious, on Edge 0 1 1 2   Control/stop worrying 0 0 0 1  Worry too much - different things 0 1 0 2  Trouble relaxing 0 2 2 1   Restless  0 1 1  Easily annoyed or irritable 1 1 2 2   Afraid - awful might happen 0 0 0 1  Total GAD 7 Score  5 6 10   Anxiety Difficulty Not difficult at all Somewhat difficult Somewhat difficult Somewhat difficult          Relevant Medications   sertraline (ZOLOFT) 100 MG tablet    Return in about 6 months (around 12/11/2023) for ADHD.    [name], MD

## 2023-06-13 NOTE — Assessment & Plan Note (Signed)
Well controlled. Continue current regimen. Follow up in  6 mo  

## 2023-06-21 ENCOUNTER — Other Ambulatory Visit: Payer: Self-pay | Admitting: Family Medicine

## 2023-06-21 DIAGNOSIS — F909 Attention-deficit hyperactivity disorder, unspecified type: Secondary | ICD-10-CM

## 2023-06-29 ENCOUNTER — Encounter: Payer: Self-pay | Admitting: Family Medicine

## 2023-06-29 DIAGNOSIS — F909 Attention-deficit hyperactivity disorder, unspecified type: Secondary | ICD-10-CM

## 2023-06-29 MED ORDER — AMPHETAMINE-DEXTROAMPHET ER 25 MG PO CP24: 25.0000 mg | ORAL_CAPSULE | ORAL | 0 refills | Status: DC

## 2023-07-28 ENCOUNTER — Ambulatory Visit: Payer: BC Managed Care – PPO | Admitting: Family Medicine

## 2023-07-28 ENCOUNTER — Encounter: Payer: Self-pay | Admitting: Family Medicine

## 2023-07-28 VITALS — BP 127/77 | HR 85 | Ht 69.0 in | Wt 218.0 lb

## 2023-07-28 DIAGNOSIS — L0100 Impetigo, unspecified: Secondary | ICD-10-CM

## 2023-07-28 DIAGNOSIS — Z131 Encounter for screening for diabetes mellitus: Secondary | ICD-10-CM | POA: Diagnosis not present

## 2023-07-28 DIAGNOSIS — F909 Attention-deficit hyperactivity disorder, unspecified type: Secondary | ICD-10-CM | POA: Diagnosis not present

## 2023-07-28 DIAGNOSIS — N959 Unspecified menopausal and perimenopausal disorder: Secondary | ICD-10-CM | POA: Diagnosis not present

## 2023-07-28 DIAGNOSIS — R5383 Other fatigue: Secondary | ICD-10-CM | POA: Diagnosis not present

## 2023-07-28 DIAGNOSIS — K760 Fatty (change of) liver, not elsewhere classified: Secondary | ICD-10-CM | POA: Diagnosis not present

## 2023-07-28 MED ORDER — DOXYCYCLINE HYCLATE 100 MG PO TABS: 100.0000 mg | ORAL_TABLET | Freq: Two times a day (BID) | ORAL | 0 refills | Status: DC

## 2023-07-28 MED ORDER — MUPIROCIN 2 % EX OINT: 1.0000 | TOPICAL_OINTMENT | Freq: Two times a day (BID) | CUTANEOUS | 1 refills | Status: AC

## 2023-07-28 NOTE — Progress Notes (Signed)
Kristina Harrington - 42 y.o. female MRN [id number]  Date of birth: 1980-10-16  Subjective Chief Complaint  Patient presents with   Insect Bite    HPI Kristina Harrington is a 42 y.o. female here today with complaint of sore on her chin.  Noticed a spot on her chin couple days ago.  This had appearance of a blister which she popped.  Purulent material drained from the area.  Area was tender to touch and refilled with pus the next morning.  She has had increased erythema surrounding this.  So far has not tried anything on the area.  ROS:  A comprehensive ROS was completed and negative except as noted per HPI  No Known Allergies  Past Medical History:  Diagnosis Date   ADHD    Allergic rhinitis 12/11/2014   Dymista, singulair, claritin.     Hyperlipidemia 08/02/2016   Lumbar degenerative disc disease 09/20/2013    Past Surgical History:  Procedure Laterality Date   ARTHROSCOPIC REPAIR ACL  03/2015   KNEE SURGERY Left    ACL    Social History   Socioeconomic History   Marital status: Married    Spouse name: Not on file   Number of children: Not on file   Years of education: Not on file   Highest education level: Master's degree (e.g., MA, MS, MEng, MEd, MSW, MBA)  Occupational History   Not on file  Tobacco Use   Smoking status: Never   Smokeless tobacco: Never  Vaping Use   Vaping status: Never Used  Substance and Sexual Activity   Alcohol use: Not Currently   Drug use: No   Sexual activity: Not on file  Other Topics Concern   Not on file  Social History Narrative   Not on file   Social Determinants of Health   Financial Resource Strain: Low Risk  (12/06/2022)   Overall Financial Resource Strain (CARDIA)    Difficulty of Paying Living Expenses: Not very hard  Food Insecurity: No Food Insecurity (12/06/2022)   Hunger Vital Sign    Worried About Running Out of Food in the Last Year: Never true    Ran Out of Food in the Last Year: Never true  Transportation Needs: No  Transportation Needs (12/06/2022)   PRAPARE - Administrator, Civil Service (Medical): No    Lack of Transportation (Non-Medical): No  Physical Activity: Insufficiently Active (12/06/2022)   Exercise Vital Sign    Days of Exercise per Week: 1 day    Minutes of Exercise per Session: 10 min  Stress: Stress Concern Present (12/06/2022)   [employer] of Occupational Health - Occupational Stress Questionnaire    Feeling of Stress : Rather much  Social Connections: Moderately Integrated (12/06/2022)   Social Connection and Isolation Panel [NHANES]    Frequency of Communication with Friends and Family: Three times a week    Frequency of Social Gatherings with Friends and Family: Once a week    Attends Religious Services: More than 4 times per year    Active Member of [employer] or Organizations: No    Attends Engineer, structural: Not on file    Marital Status: Married    Family History  Problem Relation Age of Onset   Osteoporosis Mother    Constipation Mother    Healthy Father     Health Maintenance  Topic Date Due   Cervical Cancer Screening (HPV/Pap Cotest)  09/13/2023 (Originally 10/08/2021)   INFLUENZA VACCINE  12/12/2023 (Originally 04/14/2023)  Hepatitis C Screening  06/25/2024 (Originally 01/08/2001)   COVID-19 Vaccine (3 - 2023-24 season) 06/28/2024 (Originally 05/15/2023)   DTaP/Tdap/Td (3 - Td or Tdap) 09/18/2031   HIV Screening  Completed   Pneumococcal Vaccine 51-40 Years old  Aged Out   HPV VACCINES  Aged Out     ----------------------------------------------------------------------------------------------------------------------------------------------------------------------------------------------------------------- Physical Exam BP 127/77 (BP Location: Left Arm, Patient Position: Sitting, Cuff Size: Normal)   Pulse 85   Ht 5\' 9"  (1.753 m)   Wt 218 lb (98.9 kg)   SpO2 99%   BMI 32.19 kg/m   Physical Exam Constitutional:      Appearance:  Normal appearance.  HENT:     Head: Normocephalic and atraumatic.  Skin:    Comments: Crusted lesion on chin with erythematous base.    Neurological:     Mental Status: She is alert.     ------------------------------------------------------------------------------------------------------------------------------------------------------------------------------------------------------------------- Assessment and Plan  Impetigo Adding topical mupirocin.  She does appear to have some surrounding cellulitis so we will treat with doxycycline as well.  Instructed to contact clinic if symptoms worsening.  Good hand hygiene reviewed.   Meds ordered this encounter  Medications   mupirocin ointment (BACTROBAN) 2 %    Sig: Apply 1 Application topically 2 (two) times daily for 7 days.    Dispense:  22 g    Refill:  1   doxycycline (VIBRA-TABS) 100 MG tablet    Sig: Take 1 tablet (100 mg total) by mouth 2 (two) times daily.    Dispense:  20 tablet    Refill:  0    No follow-ups on file.    This visit occurred during the SARS-CoV-2 public health emergency.  Safety protocols were in place, including screening questions prior to the visit, additional usage of staff PPE, and extensive cleaning of exam room while observing appropriate contact time as indicated for disinfecting solutions.

## 2023-07-28 NOTE — Assessment & Plan Note (Addendum)
Adding topical mupirocin.  She does appear to have some surrounding cellulitis so we will treat with doxycycline as well.  Instructed to contact clinic if symptoms worsening.  Good hand hygiene reviewed.

## 2023-08-15 DIAGNOSIS — K760 Fatty (change of) liver, not elsewhere classified: Secondary | ICD-10-CM | POA: Diagnosis not present

## 2023-08-15 DIAGNOSIS — F909 Attention-deficit hyperactivity disorder, unspecified type: Secondary | ICD-10-CM | POA: Diagnosis not present

## 2023-08-15 DIAGNOSIS — R5383 Other fatigue: Secondary | ICD-10-CM | POA: Diagnosis not present

## 2023-08-15 DIAGNOSIS — Z131 Encounter for screening for diabetes mellitus: Secondary | ICD-10-CM | POA: Diagnosis not present

## 2023-11-01 ENCOUNTER — Other Ambulatory Visit: Payer: Self-pay | Admitting: Family Medicine

## 2023-11-01 DIAGNOSIS — F909 Attention-deficit hyperactivity disorder, unspecified type: Secondary | ICD-10-CM

## 2023-11-01 MED ORDER — AMPHETAMINE-DEXTROAMPHET ER 25 MG PO CP24: 25.0000 mg | ORAL_CAPSULE | ORAL | 0 refills | Status: DC

## 2023-11-07 ENCOUNTER — Ambulatory Visit
Admission: RE | Admit: 2023-11-07 | Discharge: 2023-11-07 | Disposition: A | Discharge status: Home / Self Care | Payer: BC Managed Care – PPO | Source: Ambulatory Visit | Attending: Physician Assistant | Admitting: Physician Assistant

## 2023-11-07 VITALS — BP 131/82 | HR 97 | Temp 97.9°F | Resp 17

## 2023-11-07 DIAGNOSIS — R051 Acute cough: Secondary | ICD-10-CM | POA: Diagnosis not present

## 2023-11-07 DIAGNOSIS — J329 Chronic sinusitis, unspecified: Secondary | ICD-10-CM | POA: Diagnosis not present

## 2023-11-07 DIAGNOSIS — J40 Bronchitis, not specified as acute or chronic: Secondary | ICD-10-CM

## 2023-11-07 LAB — POCT INFLUENZA A/B
Influenza A, POC: NEGATIVE
Influenza B, POC: NEGATIVE

## 2023-11-07 LAB — POC SARS CORONAVIRUS 2 AG -  ED: SARS Coronavirus 2 Ag: NEGATIVE

## 2023-11-07 MED ORDER — AMOXICILLIN-POT CLAVULANATE 875-125 MG PO TABS: 1.0000 | ORAL_TABLET | Freq: Two times a day (BID) | ORAL | 0 refills | Status: DC

## 2023-11-07 MED ORDER — PROMETHAZINE-DM 6.25-15 MG/5ML PO SYRP: 5.0000 mL | ORAL_SOLUTION | Freq: Three times a day (TID) | ORAL | 0 refills | Status: DC | PRN

## 2023-11-07 NOTE — ED Provider Notes (Addendum)
 [facility]    CSN: [id number] Arrival date & time: 11/07/23  1652      History   Chief Complaint Chief Complaint  Patient presents with   Cough    HPI Kristina Harrington is a 43 y.o. female.   Patient presents today with a weeklong history of URI symptoms that have significantly worsened in the past 2 days.  She reports initially she thought this was related to allergies as there was just mild congestion, sore throat, postnasal drainage.  Over the past several days she has developed cough, shortness of breath and chest tightness, worsening congestion, body aches, chills, malaise.  She reports that her husband has been sick but tested negative for COVID and flu.  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  She has been taking over-the-counter medications without improvement of symptoms.  Denies history of allergies, asthma, smoking, COPD.  She has had COVID with last episode a few years ago.  She has had COVID-19 vaccinations.  She did have a complication of DVT but denies history of PE.  She is no longer on chronic anticoagulation as DVT was thought to be provoked.  She is no concern for pregnancy.    Past Medical History:  Diagnosis Date   ADHD    Allergic rhinitis 12/11/2014   Dymista, singulair, claritin.     Hyperlipidemia 08/02/2016   Lumbar degenerative disc disease 09/20/2013    Patient Active Problem List   Diagnosis Date Noted   Impetigo 07/28/2023   History of deep vein thrombosis (DVT) of lower extremity, left popliteal vein 02/05/2022   ADHD 11/06/2020   Acute stress reaction 08/02/2016   Hyperlipidemia 08/02/2016   Rupture of anterior cruciate ligament of left knee 03/28/2015   Allergic rhinitis 12/11/2014   Iliotibial band syndrome of right side 11/02/2013   Lumbar degenerative disc disease 09/20/2013    Past Surgical History:  Procedure Laterality Date   ARTHROSCOPIC REPAIR ACL  03/2015   KNEE SURGERY Left    ACL    OB History    No obstetric history on file.      Home Medications    Prior to Admission medications   Medication Sig Start Date End Date Taking? Authorizing Provider  amoxicillin-clavulanate (AUGMENTIN) 875-125 MG tablet Take 1 tablet by mouth every 12 (twelve) hours. 11/07/23  Yes Ariyana Faw K, PA-C  promethazine-dextromethorphan (PROMETHAZINE-DM) 6.25-15 MG/5ML syrup Take 5 mLs by mouth 3 (three) times daily as needed for cough. 11/07/23  Yes Saquoia Sianez K, PA-C  amphetamine-dextroamphetamine (ADDERALL XR) 25 MG 24 hr capsule Take 1 capsule by mouth every morning. 11/01/23   Agapito Games, MD  atorvastatin (LIPITOR) 10 MG tablet Take 1 tablet (10 mg total) by mouth daily. Needs appointment and labs. 12/06/22   Agapito Games, MD  fluticasone (FLONASE) 50 MCG/ACT nasal spray Place 2 sprays into both nostrils daily. 01/19/21   [provider]  sertraline (ZOLOFT) 100 MG tablet Take 1.5 tablets (150 mg total) by mouth daily. Patient takes 150 mg daily 06/13/23   Agapito Games, MD    Family History Family History  Problem Relation Age of Onset   Osteoporosis Mother    Constipation Mother    Healthy Father     Social History Social History   Tobacco Use   Smoking status: Never   Smokeless tobacco: Never  Vaping Use   Vaping status: Never Used  Substance Use Topics   Alcohol use: Not Currently   Drug  use: No     Allergies   Patient has no known allergies.   Review of Systems Review of Systems  Constitutional:  Positive for activity change, chills and fatigue. Negative for appetite change and fever.  HENT:  Positive for congestion and sore throat. Negative for sinus pressure and sneezing.   Respiratory:  Positive for cough. Negative for shortness of breath.   Cardiovascular:  Negative for chest pain.  Gastrointestinal:  Negative for abdominal pain, diarrhea, nausea and vomiting.  Musculoskeletal:  Positive for arthralgias and myalgias.  Neurological:   Positive for headaches. Negative for dizziness and light-headedness.     Physical Exam Triage Vital Signs ED Triage Vitals  Encounter Vitals Group     BP 11/07/23 1737 131/82     Systolic BP Percentile --      Diastolic BP Percentile --      Pulse Rate 11/07/23 1737 (!) 103     Resp 11/07/23 1737 17     Temp 11/07/23 1737 97.9 F (36.6 C)     Temp Source 11/07/23 1737 Oral     SpO2 11/07/23 1737 100 %     Weight --      Height --      Head Circumference --      Peak Flow --      Pain Score 11/07/23 1738 0     Pain Loc --      Pain Education --      Exclude from Growth Chart --    No data found.  Updated Vital Signs BP 131/82 (BP Location: Right Arm)   Pulse 97   Temp 97.9 F (36.6 C) (Oral)   Resp 17   LMP 10/31/2023 (Approximate)   SpO2 99%   Visual Acuity Right Eye Distance:   Left Eye Distance:   Bilateral Distance:    Right Eye Near:   Left Eye Near:    Bilateral Near:     Physical Exam Vitals reviewed.  Constitutional:      General: She is awake. She is not in acute distress.    Appearance: Normal appearance. She is well-developed. She is not ill-appearing.     Comments: Very pleasant female appears stated age in no acute distress sitting comfortably in exam room  HENT:     Head: Normocephalic and atraumatic.     Right Ear: Tympanic membrane, ear canal and external ear normal. Tympanic membrane is not erythematous or bulging.     Left Ear: Tympanic membrane, ear canal and external ear normal. Tympanic membrane is not erythematous or bulging.     Nose:     Right Sinus: No maxillary sinus tenderness or frontal sinus tenderness.     Left Sinus: No maxillary sinus tenderness or frontal sinus tenderness.     Mouth/Throat:     Pharynx: Uvula midline. No oropharyngeal exudate or posterior oropharyngeal erythema.  Cardiovascular:     Rate and Rhythm: Normal rate and regular rhythm.     Heart sounds: Normal heart sounds, S1 normal and S2 normal. No murmur  heard. Pulmonary:     Effort: Pulmonary effort is normal.     Breath sounds: Normal breath sounds. No wheezing, rhonchi or rales.     Comments: Clear to auscultation bilaterally Psychiatric:        Behavior: Behavior is cooperative.      UC Treatments / Results  Labs (all labs ordered are listed, but only abnormal results are displayed) Labs Reviewed  POC SARS CORONAVIRUS 2 AG -  ED  POCT INFLUENZA A/B    EKG   Radiology No results found.  Procedures Procedures (including critical care time)  Medications Ordered in UC Medications - No data to display  Initial Impression / Assessment and Plan / UC Course  I have reviewed the triage vital signs and the nursing notes.  Pertinent labs & imaging results that were available during my care of the patient were reviewed by me and considered in my medical decision making (see chart for details).     Patient is well-appearing, afebrile, nontoxic.  She was initially mildly tachycardic but this improved after sitting quietly for several minutes.  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  She tested negative for both of these so we will cover for sinobronchitis with Augmentin twice daily for 10 days.  She was given Promethazine DM for cough and we discussed that this can be sedating and she is not to drive drink alcohol while taking it.  Recommended that she use over-the-counter medications for additional symptom relief including Mucinex, Flonase, Tylenol, nasal saline/sinus rinses.  She is to rest and drink plenty of fluid.  Low suspicion for PE given her clinical presentation and normal vital signs.  We discussed that if she develops any worsening symptoms she needs to be  seen immediately.  If her symptoms are not improving within 3 to 5 days or if anything worsens she is to be seen immediately.  Strict return precautions given.  Work excuse note provided.  Final Clinical Impressions(s) / UC Diagnoses   Final diagnoses:  Sinobronchitis  Acute cough     Discharge Instructions      Start Augmentin twice daily for 10 days.  Take this with food as it can upset your stomach.  Try Promethazine DM for cough.  This will make you sleepy so do not drive drink alcohol with taking it.  Use over-the-counter medications for additional symptom relief including Mucinex, Flonase, Tylenol, nasal saline/sinus rinses.  Make sure that you rest and drink plenty of fluid.  If your symptoms are not improving quickly or if anything worsens and you have heart racing, chest pain, shortness of breath, worsening cough, high fever you need to be seen immediately.     ED Prescriptions     Medication Sig Dispense Auth. Provider   amoxicillin-clavulanate (AUGMENTIN) 875-125 MG tablet Take 1 tablet by mouth every 12 (twelve) hours. 20 tablet Ramsie Ostrander K, PA-C   promethazine-dextromethorphan (PROMETHAZINE-DM) 6.25-15 MG/5ML syrup Take 5 mLs by mouth 3 (three) times daily as needed for cough. 118 mL Jandy Brackens K, PA-C      PDMP not reviewed this encounter.   Jeani Hawking, PA-C 11/07/23 1842    Jeani Hawking, PA-C 11/07/23 1843

## 2023-11-07 NOTE — ED Triage Notes (Signed)
 Pt c/o cough and post nasal drainage x 1 week. Sxs worsening in the last couple days. Some SOB. Denies fever.  Hx of pneumonia.

## 2023-11-07 NOTE — Discharge Instructions (Signed)
 Start Augmentin twice daily for 10 days.  Take this with food as it can upset your stomach.  Try Promethazine DM for cough.  This will make you sleepy so do not drive drink alcohol with taking it.  Use over-the-counter medications for additional symptom relief including Mucinex, Flonase, Tylenol, nasal saline/sinus rinses.  Make sure that you rest and drink plenty of fluid.  If your symptoms are not improving quickly or if anything worsens and you have heart racing, chest pain, shortness of breath, worsening cough, high fever you need to be seen immediately.

## 2023-11-10 ENCOUNTER — Ambulatory Visit
Admission: RE | Admit: 2023-11-10 | Discharge: 2023-11-10 | Disposition: A | Discharge status: Home / Self Care | Payer: BC Managed Care – PPO | Source: Ambulatory Visit | Attending: Family Medicine | Admitting: Family Medicine

## 2023-11-10 ENCOUNTER — Ambulatory Visit: Payer: BC Managed Care – PPO

## 2023-11-10 VITALS — BP 111/71 | HR 86 | Temp 98.4°F | Resp 18 | Ht 69.0 in | Wt 215.0 lb

## 2023-11-10 DIAGNOSIS — R059 Cough, unspecified: Secondary | ICD-10-CM | POA: Diagnosis not present

## 2023-11-10 DIAGNOSIS — R0689 Other abnormalities of breathing: Secondary | ICD-10-CM | POA: Diagnosis not present

## 2023-11-10 DIAGNOSIS — R5383 Other fatigue: Secondary | ICD-10-CM

## 2023-11-10 MED ORDER — PREDNISONE 10 MG (21) PO TBPK: ORAL_TABLET | Freq: Every day | ORAL | 0 refills | Status: DC

## 2023-11-10 MED ORDER — BENZONATATE 200 MG PO CAPS: 200.0000 mg | ORAL_CAPSULE | Freq: Three times a day (TID) | ORAL | 0 refills | Status: AC | PRN

## 2023-11-10 NOTE — ED Triage Notes (Signed)
 Pt states that she has sob, wheezing, cough and chest congestion. X1 week  Pt states that she was recently seen and isn't any better.

## 2023-11-10 NOTE — Discharge Instructions (Addendum)
 Advised patient to take medication as directed with food to completion.  Advised may take Tessalon capsules daily or as needed for cough.  Encouraged to increase daily water intake to 64 ounces per day while taking these medications.  Advised if symptoms worsen and/or unresolved please follow-up with your PCP or here for further evaluation.

## 2023-11-10 NOTE — ED Provider Notes (Signed)
 [facility]    CSN: [id number] Arrival date & time: 11/10/23  1503      History   Chief Complaint Chief Complaint  Patient presents with   Cough    Thinks there is fluid in lungs - Entered by patient    HPI Zulma Court is a 43 y.o. female.   HPI 43 year old female presents with cough and believes there is fluids in her lungs.  Patient was evaluated here on 11/06/2022 for sinobronchitis please see epic for that encounter note.  Patient reports that she is becoming extremely fatigued and reports going from upstairs to downstairs leaves her short of breath and more fatigued.  PMH significant for obesity, ADHD, and lumbar degenerative disease.   Past Medical History:  Diagnosis Date   ADHD    Allergic rhinitis 12/11/2014   Dymista, singulair, claritin.     Hyperlipidemia 08/02/2016   Lumbar degenerative disc disease 09/20/2013    Patient Active Problem List   Diagnosis Date Noted   Impetigo 07/28/2023   History of deep vein thrombosis (DVT) of lower extremity, left popliteal vein 02/05/2022   ADHD 11/06/2020   Acute stress reaction 08/02/2016   Hyperlipidemia 08/02/2016   Rupture of anterior cruciate ligament of left knee 03/28/2015   Allergic rhinitis 12/11/2014   Iliotibial band syndrome of right side 11/02/2013   Lumbar degenerative disc disease 09/20/2013    Past Surgical History:  Procedure Laterality Date   ARTHROSCOPIC REPAIR ACL  03/2015   KNEE SURGERY Left    ACL    OB History   No obstetric history on file.      Home Medications    Prior to Admission medications   Medication Sig Start Date End Date Taking? Authorizing Provider  amoxicillin-clavulanate (AUGMENTIN) 875-125 MG tablet Take 1 tablet by mouth every 12 (twelve) hours. 11/07/23  Yes Raspet, Erin K, PA-C  amphetamine-dextroamphetamine (ADDERALL XR) 25 MG 24 hr capsule Take 1 capsule by mouth every morning. 11/01/23  Yes Agapito Games, MD  atorvastatin (LIPITOR) 10 MG  tablet Take 1 tablet (10 mg total) by mouth daily. Needs appointment and labs. 12/06/22  Yes Agapito Games, MD  benzonatate (TESSALON) 200 MG capsule Take 1 capsule (200 mg total) by mouth 3 (three) times daily as needed for up to 7 days. 11/10/23 11/17/23 Yes Trevor Iha, FNP  fluticasone (FLONASE) 50 MCG/ACT nasal spray Place 2 sprays into both nostrils daily. 01/19/21  Yes [provider]  predniSONE (STERAPRED UNI-PAK 21 TAB) 10 MG (21) TBPK tablet Take by mouth daily. Take 6 tabs by mouth daily  for 2 days, then 5 tabs for 2 days, then 4 tabs for 2 days, then 3 tabs for 2 days, 2 tabs for 2 days, then 1 tab by mouth daily for 2 days 11/10/23  Yes Trevor Iha, FNP  promethazine-dextromethorphan (PROMETHAZINE-DM) 6.25-15 MG/5ML syrup Take 5 mLs by mouth 3 (three) times daily as needed for cough. 11/07/23  Yes Raspet, Erin K, PA-C  sertraline (ZOLOFT) 100 MG tablet Take 1.5 tablets (150 mg total) by mouth daily. Patient takes 150 mg daily 06/13/23  Yes Agapito Games, MD    Family History Family History  Problem Relation Age of Onset   Osteoporosis Mother    Constipation Mother    Healthy Father     Social History Social History   Tobacco Use   Smoking status: Never   Smokeless tobacco: Never  Vaping Use   Vaping status: Never Used  Substance Use Topics  Alcohol use: Not Currently   Drug use: No     Allergies   Patient has no known allergies.   Review of Systems Review of Systems   Physical Exam Triage Vital Signs ED Triage Vitals  Encounter Vitals Group     BP      Systolic BP Percentile      Diastolic BP Percentile      Pulse      Resp      Temp      Temp src      SpO2      Weight      Height      Head Circumference      Peak Flow      Pain Score      Pain Loc      Pain Education      Exclude from Growth Chart    No data found.  Updated Vital Signs BP 111/71 (BP Location: Left Arm)   Pulse 86   Temp 98.4 F (36.9 C) (Oral)    Resp 18   Ht 5\' 9"  (1.753 m)   Wt 215 lb (97.5 kg)   LMP 10/31/2023 (Approximate)   SpO2 96%   BMI 31.75 kg/m    Physical Exam Vitals and nursing note reviewed.  Constitutional:      Appearance: Normal appearance. She is normal weight. She is ill-appearing.  HENT:     Head: Normocephalic and atraumatic.     Right Ear: Tympanic membrane, ear canal and external ear normal.     Left Ear: Tympanic membrane, ear canal and external ear normal.     Mouth/Throat:     Mouth: Mucous membranes are moist.     Pharynx: Oropharynx is clear.  Eyes:     Extraocular Movements: Extraocular movements intact.     Conjunctiva/sclera: Conjunctivae normal.     Pupils: Pupils are equal, round, and reactive to light.  Cardiovascular:     Rate and Rhythm: Normal rate and regular rhythm.     Pulses: Normal pulses.     Heart sounds: Normal heart sounds.  Pulmonary:     Effort: Pulmonary effort is normal.     Breath sounds: Rales present. No wheezing or rhonchi.     Comments: Fine rales with inspiratory wheezes noted over right upper/right middle, infrequent nonproductive cough on exam Musculoskeletal:        General: Normal range of motion.     Cervical back: Normal range of motion and neck supple.  Skin:    General: Skin is warm and dry.  Neurological:     General: No focal deficit present.     Mental Status: She is alert and oriented to person, place, and time. Mental status is at baseline.  Psychiatric:        Mood and Affect: Mood normal.        Behavior: Behavior normal.      UC Treatments / Results  Labs (all labs ordered are listed, but only abnormal results are displayed) Labs Reviewed - No data to display  EKG   Radiology DG Chest 2 View Result Date: 11/10/2023 CLINICAL DATA:  Cough and fatigue EXAM: CHEST - 2 VIEW COMPARISON:  Chest radiograph 08/09/2022 FINDINGS: The heart size and mediastinal contours are within normal limits. Both lungs are clear. The visualized skeletal  structures are unremarkable. IMPRESSION: No active cardiopulmonary disease. Electronically Signed   By: Annia Belt M.D.   On: 11/10/2023 16:49    Procedures Procedures (including  critical care time)  Medications Ordered in UC Medications - No data to display  Initial Impression / Assessment and Plan / UC Course  I have reviewed the triage vital signs and the nursing notes.  Pertinent labs & imaging results that were available during my care of the patient were reviewed by me and considered in my medical decision making (see chart for details).     MDM: 1.  Cough, unspecified type-CXR reveals above Rx'd Tessalon 200 mg capsules: Take 1 capsule 3 times daily, as needed Rx'd Sterapred Unipak tapering from 60 mg to 10 mg over 10 days; 2.  Adventitious breath sounds-CXR results revealed above. Advised patient to take medication as directed with food to completion.  Advised may take Tessalon capsules daily or as needed for cough.  Encouraged to increase daily water intake to 64 ounces per day while taking these medications.  Advised if symptoms worsen and/or unresolved please follow-up with your PCP or here for further evaluation.  Patient discharged home, hemodynamically stable.  Work note provided to patient prior to discharge. Final Clinical Impressions(s) / UC Diagnoses   Final diagnoses:  Cough, unspecified type  Adventitious breath sounds     Discharge Instructions      Advised patient to take medication as directed with food to completion.  Advised may take Tessalon capsules daily or as needed for cough.  Encouraged to increase daily water intake to 64 ounces per day while taking these medications.  Advised if symptoms worsen and/or unresolved please follow-up with your PCP or here for further evaluation.     ED Prescriptions     Medication Sig Dispense Auth. Provider   predniSONE (STERAPRED UNI-PAK 21 TAB) 10 MG (21) TBPK tablet Take by mouth daily. Take 6 tabs by mouth daily   for 2 days, then 5 tabs for 2 days, then 4 tabs for 2 days, then 3 tabs for 2 days, 2 tabs for 2 days, then 1 tab by mouth daily for 2 days 42 tablet Trevor Iha, FNP   benzonatate (TESSALON) 200 MG capsule Take 1 capsule (200 mg total) by mouth 3 (three) times daily as needed for up to 7 days. 40 capsule Trevor Iha, FNP      PDMP not reviewed this encounter.   Trevor Iha, FNP 11/10/23 1710

## 2023-12-12 ENCOUNTER — Ambulatory Visit: Payer: Self-pay | Admitting: Family Medicine

## 2023-12-12 ENCOUNTER — Encounter: Payer: Self-pay | Admitting: Family Medicine

## 2023-12-12 VITALS — BP 110/52 | HR 72 | Ht 69.0 in | Wt 223.0 lb

## 2023-12-12 DIAGNOSIS — F430 Acute stress reaction: Secondary | ICD-10-CM | POA: Diagnosis not present

## 2023-12-12 DIAGNOSIS — F909 Attention-deficit hyperactivity disorder, unspecified type: Secondary | ICD-10-CM

## 2023-12-12 MED ORDER — AMPHETAMINE-DEXTROAMPHET ER 30 MG PO CP24: 25.0000 mg | ORAL_CAPSULE | ORAL | 0 refills | Status: DC

## 2023-12-12 NOTE — Assessment & Plan Note (Addendum)
 Will inc to 30mg  XR.  Call if not improving can make an adjustment for next month if needed.  If she is otherwise doing well plan to follow-up in 6 months.  Just call if any side effects such as chest pain palpitations or insomnia.  Call if any new S.E.

## 2023-12-12 NOTE — Patient Instructions (Signed)
 Note in about 3 weeks about the medication.

## 2023-12-12 NOTE — Progress Notes (Signed)
   Established Patient Office Visit  Subjective  Patient ID: Kristina Harrington, female    DOB: 01-03-1981  Age: 43 y.o. MRN: [id number]  Chief Complaint  Patient presents with   ADHD         HPI  F/U Mood and ADHD = she would like to adjust medication.  Was like it could be a little stronger and more effective she does not want to take any additional medication the afternoon she can tell it usually wears off around 430.    ROS    Objective:     BP (!) 110/52   Pulse 72   Ht 5\' 9"  (1.753 m)   Wt 223 lb (101.2 kg)   SpO2 94%   BMI 32.93 kg/m    Physical Exam Vitals and nursing note reviewed.  Constitutional:      Appearance: Normal appearance.  HENT:     Head: Normocephalic and atraumatic.  Eyes:     Conjunctiva/sclera: Conjunctivae normal.  Cardiovascular:     Rate and Rhythm: Normal rate and regular rhythm.  Pulmonary:     Effort: Pulmonary effort is normal.     Breath sounds: Normal breath sounds.  Skin:    General: Skin is warm and dry.  Neurological:     Mental Status: She is alert.  Psychiatric:        Mood and Affect: Mood normal.      No results found for any visits on 12/12/23.    The 10-year ASCVD risk score (Arnett DK, et al., 2019) is: 0.4%    Assessment & Plan:   Problem List Items Addressed This Visit       Other   ADHD - Primary    Will inc to 30mg  XR.  Call if not improving can make an adjustment for next month if needed.  If she is otherwise doing well plan to follow-up in 6 months.  Just call if any side effects such as chest pain palpitations or insomnia.  Call if any new S.E.        Relevant Medications   amphetamine-dextroamphetamine (ADDERALL XR) 30 MG 24 hr capsule   Acute stress reaction   Doing well on sertraline 150 mg.  Work has been more stressful as it is the end of the year.         Return in about 6 months (around 06/12/2024) for ADD.   I spent 25 minutes on the day of the encounter to include pre-visit record  review, face-to-face time with the patient and post visit ordering of test.   [name], MD

## 2023-12-12 NOTE — Assessment & Plan Note (Signed)
 Doing well on sertraline 150 mg.  Work has been more stressful as it is the end of the year.

## 2024-01-11 ENCOUNTER — Other Ambulatory Visit: Payer: Self-pay | Admitting: Family Medicine

## 2024-01-11 DIAGNOSIS — F909 Attention-deficit hyperactivity disorder, unspecified type: Secondary | ICD-10-CM

## 2024-01-11 MED ORDER — AMPHETAMINE-DEXTROAMPHET ER 30 MG PO CP24: 30.0000 mg | ORAL_CAPSULE | ORAL | 0 refills | Status: DC

## 2024-01-11 NOTE — Telephone Encounter (Signed)
 Meds ordered this encounter  Medications   amphetamine -dextroamphetamine (ADDERALL  XR) 30 MG 24 hr capsule    Sig: Take 1 capsule (30 mg total) by mouth every morning.    Dispense:  30 capsule    Refill:  0   amphetamine -dextroamphetamine (ADDERALL  XR) 30 MG 24 hr capsule    Sig: Take 1 capsule (30 mg total) by mouth every morning.    Dispense:  30 capsule    Refill:  0

## 2024-02-14 ENCOUNTER — Other Ambulatory Visit: Payer: Self-pay | Admitting: Family Medicine

## 2024-02-14 DIAGNOSIS — F430 Acute stress reaction: Secondary | ICD-10-CM

## 2024-03-14 DIAGNOSIS — N959 Unspecified menopausal and perimenopausal disorder: Secondary | ICD-10-CM | POA: Diagnosis not present

## 2024-03-14 DIAGNOSIS — Z01411 Encounter for gynecological examination (general) (routine) with abnormal findings: Secondary | ICD-10-CM | POA: Diagnosis not present

## 2024-03-14 DIAGNOSIS — Z6832 Body mass index (BMI) 32.0-32.9, adult: Secondary | ICD-10-CM | POA: Diagnosis not present

## 2024-03-14 DIAGNOSIS — E559 Vitamin D deficiency, unspecified: Secondary | ICD-10-CM | POA: Diagnosis not present

## 2024-03-14 DIAGNOSIS — Z1321 Encounter for screening for nutritional disorder: Secondary | ICD-10-CM | POA: Diagnosis not present

## 2024-03-14 DIAGNOSIS — R4586 Emotional lability: Secondary | ICD-10-CM | POA: Diagnosis not present

## 2024-03-14 DIAGNOSIS — E538 Deficiency of other specified B group vitamins: Secondary | ICD-10-CM | POA: Diagnosis not present

## 2024-03-14 DIAGNOSIS — R232 Flushing: Secondary | ICD-10-CM | POA: Diagnosis not present

## 2024-03-14 DIAGNOSIS — G478 Other sleep disorders: Secondary | ICD-10-CM | POA: Diagnosis not present

## 2024-03-14 DIAGNOSIS — K760 Fatty (change of) liver, not elsewhere classified: Secondary | ICD-10-CM | POA: Diagnosis not present

## 2024-03-14 DIAGNOSIS — R5383 Other fatigue: Secondary | ICD-10-CM | POA: Diagnosis not present

## 2024-03-14 DIAGNOSIS — N925 Other specified irregular menstruation: Secondary | ICD-10-CM | POA: Diagnosis not present

## 2024-03-26 ENCOUNTER — Encounter: Payer: Self-pay | Admitting: Family Medicine

## 2024-03-26 DIAGNOSIS — F909 Attention-deficit hyperactivity disorder, unspecified type: Secondary | ICD-10-CM

## 2024-03-26 MED ORDER — AMPHETAMINE-DEXTROAMPHET ER 30 MG PO CP24: 30.0000 mg | ORAL_CAPSULE | ORAL | 0 refills | Status: DC

## 2024-03-30 DIAGNOSIS — Z1231 Encounter for screening mammogram for malignant neoplasm of breast: Secondary | ICD-10-CM | POA: Diagnosis not present

## 2024-03-30 LAB — HM MAMMOGRAPHY

## 2024-04-02 DIAGNOSIS — F909 Attention-deficit hyperactivity disorder, unspecified type: Secondary | ICD-10-CM | POA: Diagnosis not present

## 2024-04-02 DIAGNOSIS — R5383 Other fatigue: Secondary | ICD-10-CM | POA: Diagnosis not present

## 2024-04-02 DIAGNOSIS — Z131 Encounter for screening for diabetes mellitus: Secondary | ICD-10-CM | POA: Diagnosis not present

## 2024-04-02 DIAGNOSIS — K760 Fatty (change of) liver, not elsewhere classified: Secondary | ICD-10-CM | POA: Diagnosis not present

## 2024-05-10 ENCOUNTER — Other Ambulatory Visit: Payer: Self-pay | Admitting: Family Medicine

## 2024-05-10 DIAGNOSIS — F430 Acute stress reaction: Secondary | ICD-10-CM

## 2024-05-15 ENCOUNTER — Encounter: Payer: Self-pay | Admitting: Family Medicine

## 2024-05-15 ENCOUNTER — Ambulatory Visit: Admitting: Family Medicine

## 2024-05-15 ENCOUNTER — Encounter: Payer: Self-pay | Admitting: Sports Medicine

## 2024-05-15 VITALS — BP 115/61 | HR 71 | Temp 98.1°F | Ht 69.0 in | Wt 215.0 lb

## 2024-05-15 DIAGNOSIS — J019 Acute sinusitis, unspecified: Secondary | ICD-10-CM

## 2024-05-15 MED ORDER — AMOXICILLIN 875 MG PO TABS: 875.0000 mg | ORAL_TABLET | Freq: Two times a day (BID) | ORAL | 0 refills | Status: DC

## 2024-05-15 NOTE — Progress Notes (Signed)
   Acute Office Visit  Subjective:     Patient ID: Kristina Harrington, female    DOB: 03/20/1981, 43 y.o.   MRN: [id number]  No chief complaint on file.   HPI Patient is in today for nasal congestion with yellow and green mucus that started about 8 days ago.  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  No major sore throat.  Mild cough but mostly nasal pressure and sinus drainage.  She said she started using her Nettie pot a couple of times a day immediately.  She did not test for flu or COVID at home.  ROS      Objective:    BP 115/61   Pulse 71   Temp 98.1 F (36.7 C) (Oral)   Ht 5' 9 (1.753 m)   Wt 215 lb 0.6 oz (97.5 kg)   SpO2 97%   BMI 31.76 kg/m    Physical Exam Constitutional:      Appearance: Normal appearance.  HENT:     Head: Normocephalic and atraumatic.     Right Ear: Tympanic membrane, ear canal and external ear normal. There is no impacted cerumen.     Left Ear: Tympanic membrane, ear canal and external ear normal. There is no impacted cerumen.     Nose: Nose normal.     Mouth/Throat:     Pharynx: Oropharynx is clear.  Eyes:     Conjunctiva/sclera: Conjunctivae normal.  Cardiovascular:     Rate and Rhythm: Normal rate and regular rhythm.  Pulmonary:     Effort: Pulmonary effort is normal.     Breath sounds: Normal breath sounds.  Musculoskeletal:     Cervical back: Neck supple. No tenderness.  Lymphadenopathy:     Cervical: No cervical adenopathy.  Skin:    General: Skin is warm and dry.  Neurological:     Mental Status: She is alert and oriented to person, place, and time.  Psychiatric:        Mood and Affect: Mood normal.     No results found for any visits on 05/15/24.      Assessment & Plan:   Problem List Items Addressed This Visit   None Visit Diagnoses       Acute non-recurrent sinusitis,  unspecified location    -  Primary   Relevant Medications   amoxicillin  (AMOXIL ) 875 MG tablet       Acute sinusitis-will treat with amoxicillin  continue with Nettie pot.  Stay hydrated.  Work note provided for today if not improving by the end of the week please let us  know.  Meds ordered this encounter  Medications   amoxicillin  (AMOXIL ) 875 MG tablet    Sig: Take 1 tablet (875 mg total) by mouth 2 (two) times daily.    Dispense:  14 tablet    Refill:  0    Return if symptoms worsen or fail to improve.  Dorothyann Byars, MD

## 2024-05-30 ENCOUNTER — Encounter: Payer: Self-pay | Admitting: Family Medicine

## 2024-05-31 ENCOUNTER — Telehealth: Payer: Self-pay

## 2024-05-31 DIAGNOSIS — F909 Attention-deficit hyperactivity disorder, unspecified type: Secondary | ICD-10-CM

## 2024-05-31 MED ORDER — AMPHETAMINE-DEXTROAMPHET ER 30 MG PO CP24: 30.0000 mg | ORAL_CAPSULE | ORAL | 0 refills | Status: DC

## 2024-05-31 NOTE — Telephone Encounter (Signed)
Meds ordered this encounter  Medications  . amphetamine-dextroamphetamine (ADDERALL XR) 30 MG 24 hr capsule    Sig: Take 1 capsule (30 mg total) by mouth every morning.    Dispense:  30 capsule    Refill:  0    

## 2024-06-08 ENCOUNTER — Ambulatory Visit
Admission: RE | Admit: 2024-06-08 | Discharge: 2024-06-08 | Disposition: A | Discharge status: Home / Self Care | Attending: Family Medicine | Admitting: Family Medicine

## 2024-06-08 VITALS — BP 103/69 | HR 77 | Temp 98.7°F | Resp 18 | Ht 69.0 in | Wt 210.0 lb

## 2024-06-08 DIAGNOSIS — K120 Recurrent oral aphthae: Secondary | ICD-10-CM

## 2024-06-08 DIAGNOSIS — J0101 Acute recurrent maxillary sinusitis: Secondary | ICD-10-CM

## 2024-06-08 LAB — POCT RAPID STREP A (OFFICE): Rapid Strep A Screen: NEGATIVE

## 2024-06-08 MED ORDER — TRIAMCINOLONE ACETONIDE 0.1 % MT PSTE
PASTE | OROMUCOSAL | 0 refills | Status: DC
Start: 2024-06-08 — End: 2024-08-02

## 2024-06-08 MED ORDER — CEFDINIR 300 MG PO CAPS: 300.0000 mg | ORAL_CAPSULE | Freq: Two times a day (BID) | ORAL | 0 refills | Status: DC

## 2024-06-08 NOTE — ED Provider Notes (Signed)
 [name]    CSN: [id number] Arrival date & time: 06/08/24  0800      History   Chief Complaint Chief Complaint  Patient presents with   Sore Throat    Have had canker sores of and on for one week. Yellow, dark yellow and green mucus when using my neti pot. Cannot get rid of canker sores and now large white spot on throat. - Entered by patient    HPI Kristina Harrington is a 43 y.o. female.   One week ago patient developed a sore throat and sinus congestion that have persisted.  She then developed scattered mouth ulcers, with soreness in her neck, and now has pain in her maxillary sinus areas with discolored mucous.  She denies cough and fever but had chills last night.  She has a history of pneumonia and recurring sinusitis.  The history is provided by the patient.    Past Medical History:  Diagnosis Date   ADHD    Allergic rhinitis 12/11/2014   Dymista, singulair, claritin.     Hyperlipidemia 08/02/2016   Lumbar degenerative disc disease 09/20/2013    Patient Active Problem List   Diagnosis Date Noted   Impetigo 07/28/2023   History of deep vein thrombosis (DVT) of lower extremity, left popliteal vein 02/05/2022   ADHD 11/06/2020   Acute stress reaction 08/02/2016   Hyperlipidemia 08/02/2016   Rupture of anterior cruciate ligament of left knee 03/28/2015   Allergic rhinitis 12/11/2014   Iliotibial band syndrome of right side 11/02/2013   Lumbar degenerative disc disease 09/20/2013    Past Surgical History:  Procedure Laterality Date   ARTHROSCOPIC REPAIR ACL  03/2015   KNEE SURGERY Left    ACL    OB History   No obstetric history on file.      Home Medications    Prior to Admission medications   Medication Sig Start Date End Date Taking? Authorizing Provider  amphetamine -dextroamphetamine (ADDERALL  XR) 30 MG 24 hr capsule Take 1 capsule (30 mg total) by mouth every morning. 04/23/24  Yes Alvan Dorothyann BIRCH, MD   amphetamine -dextroamphetamine (ADDERALL  XR) 30 MG 24 hr capsule Take 1 capsule (30 mg total) by mouth every morning. 05/31/24  Yes Alvan Dorothyann BIRCH, MD  atorvastatin  (LIPITOR) 10 MG tablet Take 1 tablet (10 mg total) by mouth daily. Needs appointment and labs. 12/06/22  Yes Alvan Dorothyann BIRCH, MD  cefdinir  (OMNICEF ) 300 MG capsule Take 1 capsule (300 mg total) by mouth 2 (two) times daily. 06/08/24  Yes Pauline Garnette LABOR, MD  fluticasone  (FLONASE ) 50 MCG/ACT nasal spray Place 2 sprays into both nostrils daily. 01/19/21  Yes [provider]  sertraline  (ZOLOFT ) 100 MG tablet TAKE 1 & 1/2 (ONE & ONE-HALF) TABLETS BY MOUTH ONCE DAILY 05/11/24  Yes Alvan Dorothyann BIRCH, MD  triamcinolone  (KENALOG ) 0.1 % paste Place thin layer on mouth ulcers four times daily 06/08/24  Yes Janeth Terry, Garnette LABOR, MD    Family History Family History  Problem Relation Age of Onset   Osteoporosis Mother    Constipation Mother    Healthy Father     Social History Social History   Tobacco Use   Smoking status: Never   Smokeless tobacco: Never  Vaping Use   Vaping status: Never Used  Substance Use Topics   Alcohol use: Not Currently   Drug use: No     Allergies   Patient has no known allergies.   Review of Systems Review of Systems + sore throat No  cough No pleuritic pain No wheezing + nasal congestion + post-nasal drainage + sinus pain/pressure No itchy/red eyes ? earache No hemoptysis No SOB No fever, + chills No nausea No vomiting No abdominal pain No diarrhea No urinary symptoms No skin rash + fatigue No myalgias No headache    Physical Exam Triage Vital Signs ED Triage Vitals  Encounter Vitals Group     BP 06/08/24 0821 103/69     Girls Systolic BP Percentile --      Girls Diastolic BP Percentile --      Boys Systolic BP Percentile --      Boys Diastolic BP Percentile --      Pulse Rate 06/08/24 0821 77     Resp 06/08/24 0821 18     Temp 06/08/24 0821 98.7 F  (37.1 C)     Temp Source 06/08/24 0821 Oral     SpO2 06/08/24 0821 98 %     Weight 06/08/24 0822 210 lb (95.3 kg)     Height 06/08/24 0822 5' 9 (1.753 m)     Head Circumference --      Peak Flow --      Pain Score 06/08/24 0822 6     Pain Loc --      Pain Education --      Exclude from Growth Chart --    No data found.  Updated Vital Signs BP 103/69 (BP Location: Right Arm)   Pulse 77   Temp 98.7 F (37.1 C) (Oral)   Resp 18   Ht 5' 9 (1.753 m)   Wt 95.3 kg   LMP 06/04/2024   SpO2 98%   BMI 31.01 kg/m   Visual Acuity Right Eye Distance:   Left Eye Distance:   Bilateral Distance:    Right Eye Near:   Left Eye Near:    Bilateral Near:     Physical Exam Nursing notes and Vital Signs reviewed. Appearance:  Patient appears stated age, and in no acute distress Eyes:  Pupils are equal, round, and reactive to light and accomodation.  Extraocular movement is intact.  Conjunctivae are not inflamed  Ears:  Canals normal.  Tympanic membranes normal.  Nose:  Congested turbinates.   Maxillary sinus tenderness is present.  Pharynx:  Mildly erythematous. Mouth: Scattered shallow ulcers Neck:  Supple.  Mildly enlarged lateral nodes are present, tender to palpation on the left.   Lungs:  Clear to auscultation.  Breath sounds are equal.  Moving air well. Heart:  Regular rate and rhythm without murmurs, rubs, or gallops.  Abdomen:  Nontender without masses or hepatosplenomegaly.  Bowel sounds are present.  No CVA or flank tenderness.  Extremities:  No edema.  Skin:  No rash present.   UC Treatments / Results  Labs (all labs ordered are listed, but only abnormal results are displayed) Labs Reviewed  POCT RAPID STREP A (OFFICE) negative    EKG   Radiology No results found.  Procedures Procedures (including critical care time)  Medications Ordered in UC Medications - No data to display  Initial Impression / Assessment and Plan / UC Course  I have reviewed the triage  vital signs and the nursing notes.  Pertinent labs & imaging results that were available during my care of the patient were reviewed by me and considered in my medical decision making (see chart for details).    Begin Omnicef  300mg  BID for one week, and Kenalog  in [place] for mouth ulcers. Followup with Family Doctor if not  improved in one week.   Final Clinical Impressions(s) / UC Diagnoses   Final diagnoses:  Acute recurrent maxillary sinusitis  Aphthous ulcer of mouth     Discharge Instructions      May use Afrin nasal spray (or generic oxymetazoline) each morning for about 5 days and then discontinue.  Also recommend using saline nasal spray several times daily and saline nasal irrigation (AYR is a common brand).  Use Flonase  nasal spray each morning after using Afrin nasal spray and saline nasal irrigation. Try warm salt water gargles for sore throat and mouth ulcers.    ED Prescriptions     Medication Sig Dispense Auth. Provider   cefdinir  (OMNICEF ) 300 MG capsule Take 1 capsule (300 mg total) by mouth 2 (two) times daily. 14 capsule Pauline Garnette LABOR, MD   triamcinolone  (KENALOG ) 0.1 % paste Place thin layer on mouth ulcers four times daily 5 g Pauline Garnette LABOR, MD         Pauline Garnette LABOR, MD 06/08/24 [phone]

## 2024-06-08 NOTE — Discharge Instructions (Addendum)
 May use Afrin nasal spray (or generic oxymetazoline) each morning for about 5 days and then discontinue.  Also recommend using saline nasal spray several times daily and saline nasal irrigation (AYR is a common brand).  Use Flonase  nasal spray each morning after using Afrin nasal spray and saline nasal irrigation. Try warm salt water gargles for sore throat and mouth ulcers.

## 2024-06-08 NOTE — ED Triage Notes (Signed)
 Patient c/o sore throat x 1 week, mucous discolored, white spots on the back of her throat.  Canker sores have developed, neck and throat pain.  Patient has taken Mucinex , using a netty pot and gargling w/warm salt water.

## 2024-06-12 ENCOUNTER — Ambulatory Visit: Admitting: Family Medicine

## 2024-06-12 VITALS — BP 111/66 | HR 70 | Ht 69.0 in | Wt 213.0 lb

## 2024-06-12 DIAGNOSIS — F909 Attention-deficit hyperactivity disorder, unspecified type: Secondary | ICD-10-CM | POA: Diagnosis not present

## 2024-06-12 DIAGNOSIS — R079 Chest pain, unspecified: Secondary | ICD-10-CM | POA: Diagnosis not present

## 2024-06-12 DIAGNOSIS — E785 Hyperlipidemia, unspecified: Secondary | ICD-10-CM | POA: Diagnosis not present

## 2024-06-12 MED ORDER — AMPHETAMINE-DEXTROAMPHET ER 30 MG PO CP24: 30.0000 mg | ORAL_CAPSULE | ORAL | 0 refills | Status: DC

## 2024-06-12 NOTE — Progress Notes (Signed)
 Established Patient Office Visit  Subjective  Patient ID: Kristina Harrington, female    DOB: 04-12-1981  Age: 43 y.o. MRN: [id number]  Chief Complaint  Patient presents with   ADHD    HPI  Discussed the use of AI scribe software for clinical note transcription with the patient, who gave verbal consent to proceed.  History of Present Illness Kristina Harrington is a 43 year old female who presents with persistent canker sores and chest discomfort.  Oral ulcerations - Persistent canker sores located in the throat - Suspects increased allergen outdoor exposure as a potential trigger - Not currently using allergy medications and has not for a long time - Using a neti pot for symptom relief - Not using Flonase  regularly - Wonders if needs to see ENT again.    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  Constitutional:      Appearance: Normal appearance.  HENT:     Head: Normocephalic and atraumatic.  Eyes:     Conjunctiva/sclera: Conjunctivae normal.  Cardiovascular:     Rate and Rhythm: Normal rate and  regular rhythm.  Pulmonary:     Effort: Pulmonary effort is normal.     Breath sounds: Normal breath sounds.  Skin:    General: Skin is warm and dry.  Neurological:     Mental Status: She is alert.  Psychiatric:        Mood and Affect: Mood normal.      No results found for any visits on 06/12/24.    The ASCVD Risk score (Arnett DK, et al., 2019) failed to calculate for the following reasons:   Cannot find a previous HDL lab   Cannot find a previous total cholesterol lab    Assessment & Plan:   Problem List Items Addressed This Visit       Other   Hyperlipidemia - Primary   Had labs done elsewhere. She will send those to me.        ADHD   Relevant Medications   amphetamine -dextroamphetamine (ADDERALL  XR) 30 MG 24 hr capsule (Start on 07/10/2024)   amphetamine -dextroamphetamine (ADDERALL  XR) 30 MG 24 hr capsule   amphetamine -dextroamphetamine (ADDERALL  XR) 30 MG 24 hr capsule (Start on 08/12/2024)   Other Visit Diagnoses       Left-sided chest pain       Relevant Orders   DG Chest 2 View        Assessment and Plan Assessment & Plan Acute pharyngitis with canker sores Persistent canker sores despite antibiotics. She is worried about risk  of pneumonia due to drainage and chest pain. - Continue antibiotics. - Order chest x-ray if no improvement or chest pain persists.  Allergic rhinitis Exacerbated by outdoor exposure and lack of medication. Not using Flonase  regularly. - Restart and use Flonase  regularly. - Use neti pot before Flonase . - Consider adding oral antihistamine during high exposure. - Discuss ENT or allergy referral if persistent.  Attention-deficit hyperactivity disorder ADHD managed with Adderall  30 mg. Effective with no issues reported. - Continue Adderall  30 mg.  General Health Maintenance Pap smear records missing. Mammogram cleared in July. - Contact [facility] for Pap smear record.   Return in about 6 months (around 12/10/2024) for  ADD.    Dorothyann Byars, MD

## 2024-06-12 NOTE — Assessment & Plan Note (Signed)
 Had labs done elsewhere. She will send those to me.

## 2024-08-02 ENCOUNTER — Ambulatory Visit
Admission: RE | Admit: 2024-08-02 | Discharge: 2024-08-02 | Disposition: A | Discharge status: Home / Self Care | Attending: Family Medicine | Admitting: Family Medicine

## 2024-08-02 ENCOUNTER — Other Ambulatory Visit: Payer: Self-pay

## 2024-08-02 VITALS — BP 121/65 | HR 94 | Temp 98.4°F | Resp 16

## 2024-08-02 DIAGNOSIS — R059 Cough, unspecified: Secondary | ICD-10-CM

## 2024-08-02 DIAGNOSIS — J309 Allergic rhinitis, unspecified: Secondary | ICD-10-CM | POA: Diagnosis not present

## 2024-08-02 DIAGNOSIS — J069 Acute upper respiratory infection, unspecified: Secondary | ICD-10-CM | POA: Diagnosis not present

## 2024-08-02 MED ORDER — PREDNISONE 20 MG PO TABS: ORAL_TABLET | ORAL | 0 refills | Status: DC

## 2024-08-02 MED ORDER — FEXOFENADINE HCL 180 MG PO TABS: 180.0000 mg | ORAL_TABLET | Freq: Every day | ORAL | 0 refills | Status: DC

## 2024-08-02 MED ORDER — DOXYCYCLINE HYCLATE 100 MG PO CAPS: 100.0000 mg | ORAL_CAPSULE | Freq: Two times a day (BID) | ORAL | 0 refills | Status: AC

## 2024-08-02 NOTE — ED Triage Notes (Signed)
 Sick since last Thursday, has had continued fatigue, cough. Cough is prod of cloudy yellow/green sputum. No sinus pressure. Does get frequent sinusitis. Reports she hears congestion in her chest. Has used neti pot and flonase . Got mucinex  but has not taken yet. Has not checked temperature.

## 2024-08-02 NOTE — Discharge Instructions (Addendum)
 Advised patient take medications as directed with food to completion.  Advised take prednisone  and Allegra  with first dose of doxycycline  for the next 5 of 7 days.  Advised may use Allegra  as needed afterwards for concurrent postnasal drainage/drip/runny nose.  Encouraged increase daily water intake to 64 ounces per day while taking these medications.  Advised if symptoms worsen and/or unresolved please follow-up with your PCP or here for further evaluation.

## 2024-08-02 NOTE — ED Provider Notes (Signed)
 Kristina Harrington    CSN: [id number] Arrival date & time: 08/02/24  1556      History   Chief Complaint Chief Complaint  Patient presents with   Cough    Entered by patient    HPI Kristina Harrington is a 43 y.o. female.   HPI Pleasant 43 year old female presents with fatigue, cough and productive yellowish-green sputum for 1 week.  Reports history of frequent sinus infections and has used Nettie pot and Flonase .  Patient reports congestion and head and chest.  Patient evaluated/treated here on 06/08/2024 for acute maxillary sinusitis.  Please see epic for that encounter note. PMH significant for obesity, lumbar degenerative disc disease, and ADD.  Past Medical History:  Diagnosis Date   ADHD    Allergic rhinitis 12/11/2014   Dymista, singulair, claritin.     Hyperlipidemia 08/02/2016   Lumbar degenerative disc disease 09/20/2013    Patient Active Problem List   Diagnosis Date Noted   Impetigo 07/28/2023   History of deep vein thrombosis (DVT) of lower extremity, left popliteal vein 02/05/2022   ADHD 11/06/2020   Acute stress reaction 08/02/2016   Hyperlipidemia 08/02/2016   Rupture of anterior cruciate ligament of left knee 03/28/2015   Allergic rhinitis 12/11/2014   Iliotibial band syndrome of right side 11/02/2013   Lumbar degenerative disc disease 09/20/2013    Past Surgical History:  Procedure Laterality Date   ARTHROSCOPIC REPAIR ACL  03/2015   KNEE SURGERY Left    ACL    OB History   No obstetric history on file.      Home Medications    Prior to Admission medications   Medication Sig Start Date End Date Taking? Authorizing Provider  doxycycline  (VIBRAMYCIN ) 100 MG capsule Take 1 capsule (100 mg total) by mouth 2 (two) times daily for 7 days. 08/02/24 08/09/24 Yes Teddy Sharper, FNP  fexofenadine  [facility] ALLERGY) 180 MG tablet Take 1 tablet (180 mg total) by mouth daily for 15 days. 08/02/24 08/17/24 Yes Teddy Sharper, FNP  predniSONE   (DELTASONE ) 20 MG tablet Take 3 tabs PO daily x 5 days. 08/02/24  Yes Teddy Sharper, FNP  amphetamine -dextroamphetamine (ADDERALL  XR) 30 MG 24 hr capsule Take 1 capsule (30 mg total) by mouth every morning. 07/10/24   Alvan Dorothyann BIRCH, MD  amphetamine -dextroamphetamine (ADDERALL  XR) 30 MG 24 hr capsule Take 1 capsule (30 mg total) by mouth every morning. 06/12/24   Alvan Dorothyann BIRCH, MD  amphetamine -dextroamphetamine (ADDERALL  XR) 30 MG 24 hr capsule Take 1 capsule (30 mg total) by mouth every morning. 08/12/24   Alvan Dorothyann BIRCH, MD  fluticasone  (FLONASE ) 50 MCG/ACT nasal spray Place 2 sprays into both nostrils daily. 01/19/21   [provider]  sertraline  (ZOLOFT ) 100 MG tablet TAKE 1 & 1/2 (ONE & ONE-HALF) TABLETS BY MOUTH ONCE DAILY 05/11/24   Alvan Dorothyann BIRCH, MD    Family History Family History  Problem Relation Age of Onset   Osteoporosis Mother    Constipation Mother    Healthy Father     Social History Social History   Tobacco Use   Smoking status: Never   Smokeless tobacco: Never  Vaping Use   Vaping status: Never Used  Substance Use Topics   Alcohol use: Not Currently   Drug use: No     Allergies   Patient has no known allergies.   Review of Systems Review of Systems  Respiratory:  Positive for cough.   All other systems reviewed and are negative.    Physical  Exam Triage Vital Signs ED Triage Vitals  Encounter Vitals Group     BP      Girls Systolic BP Percentile      Girls Diastolic BP Percentile      Boys Systolic BP Percentile      Boys Diastolic BP Percentile      Pulse      Resp      Temp      Temp src      SpO2      Weight      Height      Head Circumference      Peak Flow      Pain Score      Pain Loc      Pain Education      Exclude from Growth Chart    No data found.  Updated Vital Signs BP 121/65   Pulse 94   Temp 98.4 F (36.9 C)   Resp 16   LMP 07/24/2024 (Exact Date)   SpO2 98%   Visual  Acuity Right Eye Distance:   Left Eye Distance:   Bilateral Distance:    Right Eye Near:   Left Eye Near:    Bilateral Near:     Physical Exam Vitals and nursing note reviewed.  Constitutional:      Appearance: Normal appearance. She is obese. She is ill-appearing.  HENT:     Head: Normocephalic and atraumatic.     Right Ear: Tympanic membrane, ear canal and external ear normal.     Left Ear: Tympanic membrane, ear canal and external ear normal.     Mouth/Throat:     Mouth: Mucous membranes are moist.     Pharynx: Oropharynx is clear.     Comments: Significant amount of clear drainage of posterior oropharynx noted Eyes:     Extraocular Movements: Extraocular movements intact.     Conjunctiva/sclera: Conjunctivae normal.     Pupils: Pupils are equal, round, and reactive to light.  Cardiovascular:     Rate and Rhythm: Normal rate and regular rhythm.     Heart sounds: Normal heart sounds.  Pulmonary:     Effort: Pulmonary effort is normal.     Breath sounds: Normal breath sounds. No wheezing, rhonchi or rales.     Comments: Frequent nonproductive cough on exam Musculoskeletal:        General: Normal range of motion.  Skin:    General: Skin is warm and dry.  Neurological:     General: No focal deficit present.     Mental Status: She is alert and oriented to person, place, and time. Mental status is at baseline.  Psychiatric:        Mood and Affect: Mood normal.        Behavior: Behavior normal.      UC Treatments / Results  Labs (all labs ordered are listed, but only abnormal results are displayed) Labs Reviewed - No data to display  EKG   Radiology No results found.  Procedures Procedures (including critical Harrington time)  Medications Ordered in UC Medications - No data to display  Initial Impression / Assessment and Plan / UC Course  I have reviewed the triage vital signs and the nursing notes.  Pertinent labs & imaging results that were available during  my Harrington of the patient were reviewed by me and considered in my medical decision making (see chart for details).     MDM: 1.  Acute URI-Rx'd doxycycline  100 mg capsule: Take  1 capsule twice daily x 7 days; 2.  Cough, unspecified type-Rx prednisone  20 mg tablet: Take 3 tablets p.o. daily x 5 days; 3.  Allergic rhinitis, unspecified seasonality, unspecified trigger-Rx'd Allegra 180 mg tablet: Take 1 tablet daily x 5 days, then as needed. Advised patient take medications as directed with food to completion.  Advised take prednisone  and Allegra with first dose of doxycycline  for the next 5 of 7 days.  Advised may use Allegra as needed afterwards for concurrent postnasal drainage/drip/runny nose.  Encouraged increase daily water intake to 64 ounces per day while taking these medications.  Advised if symptoms worsen and/or unresolved please follow-up with your PCP or here for further evaluation.  Patient discharged home, hemodynamically stable. Final Clinical Impressions(s) / UC Diagnoses   Final diagnoses:  Acute URI  Cough, unspecified type  Allergic rhinitis, unspecified seasonality, unspecified trigger     Discharge Instructions      Advised patient take medications as directed with food to completion.  Advised take prednisone  and Allegra with first dose of doxycycline  for the next 5 of 7 days.  Advised may use Allegra as needed afterwards for concurrent postnasal drainage/drip/runny nose.  Encouraged increase daily water intake to 64 ounces per day while taking these medications.  Advised if symptoms worsen and/or unresolved please follow-up with your PCP or here for further evaluation.     ED Prescriptions     Medication Sig Dispense Auth. Provider   doxycycline  (VIBRAMYCIN ) 100 MG capsule Take 1 capsule (100 mg total) by mouth 2 (two) times daily for 7 days. 14 capsule Analise Glotfelty, FNP   predniSONE  (DELTASONE ) 20 MG tablet Take 3 tabs PO daily x 5 days. 15 tablet Ashton Sabine, FNP    fexofenadine  (ALLEGRA ALLERGY) 180 MG tablet Take 1 tablet (180 mg total) by mouth daily for 15 days. 15 tablet Sacha Topor, FNP      PDMP not reviewed this encounter.   Teddy Sharper, FNP 08/02/24 [phone]

## 2024-08-07 ENCOUNTER — Other Ambulatory Visit: Payer: Self-pay | Admitting: Family Medicine

## 2024-08-07 DIAGNOSIS — F430 Acute stress reaction: Secondary | ICD-10-CM

## 2024-08-11 ENCOUNTER — Other Ambulatory Visit: Payer: Self-pay

## 2024-08-11 ENCOUNTER — Other Ambulatory Visit: Payer: Self-pay | Admitting: Family Medicine

## 2024-08-11 ENCOUNTER — Ambulatory Visit
Admission: RE | Admit: 2024-08-11 | Discharge: 2024-08-11 | Disposition: A | Discharge status: Home / Self Care | Source: Ambulatory Visit | Attending: Internal Medicine | Admitting: Internal Medicine

## 2024-08-11 VITALS — BP 116/70 | HR 99 | Temp 98.4°F | Resp 16

## 2024-08-11 DIAGNOSIS — J0101 Acute recurrent maxillary sinusitis: Secondary | ICD-10-CM | POA: Diagnosis not present

## 2024-08-11 DIAGNOSIS — F430 Acute stress reaction: Secondary | ICD-10-CM

## 2024-08-11 MED ORDER — SULFAMETHOXAZOLE-TRIMETHOPRIM 800-160 MG PO TABS: 1.0000 | ORAL_TABLET | Freq: Two times a day (BID) | ORAL | 0 refills | Status: DC

## 2024-08-11 NOTE — Discharge Instructions (Addendum)
 I have prescribed an antibiotic to treat sinus infection.  Referral to ENT has been placed.  If they do not call you, please call them yourself at provided contact for further evaluation and management.

## 2024-08-11 NOTE — ED Triage Notes (Signed)
 Here on 11/20, reports cough is better but does now have facial pain around sinuses and dark yellow mucus. No fever. Completed antibiotic already. Has 1 day of allegra  left, prednisone  finished. No otc meds.

## 2024-08-11 NOTE — ED Provider Notes (Signed)
 Kristina [facility]    CSN: [id number] Arrival date & time: 08/11/24  1420      History   Chief Complaint Chief Complaint  Patient presents with   Facial Pain    Entered by patient    HPI Kristina Harrington is a 43 y.o. female.   Patient presents with persistent nasal congestion and sinus pressure.  She reports that she is now having thick yellow mucus coming from her nose.  She was originally seen on 11/20 for cough and congestion.  She was prescribed doxycycline  and prednisone .  She has completed these medications with improvement in cough, but upper respiratory symptoms have worsened.  Denies any fever.  She does have a history of recurrent sinus infections.  She was treated with amoxicillin  on 9/2 and cefdinir  on 9/28.  She was evaluated by ENT in the past and was put on allergy medications.     Past Medical History:  Diagnosis Date   ADHD    Allergic rhinitis 12/11/2014   Dymista, singulair, claritin.     Hyperlipidemia 08/02/2016   Lumbar degenerative disc disease 09/20/2013    Patient Active Problem List   Diagnosis Date Noted   Impetigo 07/28/2023   History of deep vein thrombosis (DVT) of lower extremity, left popliteal vein 02/05/2022   ADHD 11/06/2020   Acute stress reaction 08/02/2016   Hyperlipidemia 08/02/2016   Rupture of anterior cruciate ligament of left knee 03/28/2015   Allergic rhinitis 12/11/2014   Iliotibial band syndrome of right side 11/02/2013   Lumbar degenerative disc disease 09/20/2013    Past Surgical History:  Procedure Laterality Date   ARTHROSCOPIC REPAIR ACL  03/2015   KNEE SURGERY Left    ACL    OB History   No obstetric history on file.      Home Medications    Prior to Admission medications   Medication Sig Start Date End Date Taking? Authorizing Provider  sulfamethoxazole -trimethoprim  (BACTRIM  DS) 800-160 MG tablet Take 1 tablet by mouth 2 (two) times daily for 10 days. 08/11/24 08/21/24 Yes Lataisha Colan, Darryle BRAVO, FNP   amphetamine -dextroamphetamine (ADDERALL  XR) 30 MG 24 hr capsule Take 1 capsule (30 mg total) by mouth every morning. 07/10/24   Alvan Dorothyann BIRCH, MD  amphetamine -dextroamphetamine (ADDERALL  XR) 30 MG 24 hr capsule Take 1 capsule (30 mg total) by mouth every morning. 06/12/24   Alvan Dorothyann BIRCH, MD  amphetamine -dextroamphetamine (ADDERALL  XR) 30 MG 24 hr capsule Take 1 capsule (30 mg total) by mouth every morning. 08/12/24   Alvan Dorothyann BIRCH, MD  fexofenadine  (ALLEGRA  ALLERGY) 180 MG tablet Take 1 tablet (180 mg total) by mouth daily for 15 days. 08/02/24 08/17/24  Teddy Sharper, FNP  fluticasone  (FLONASE ) 50 MCG/ACT nasal spray Place 2 sprays into both nostrils daily. 01/19/21   [provider]  predniSONE  (DELTASONE ) 20 MG tablet Take 3 tabs PO daily x 5 days. 08/02/24   Teddy Sharper, FNP  sertraline  (ZOLOFT ) 100 MG tablet TAKE 1 & 1/2 (ONE & ONE-HALF) TABLETS BY MOUTH ONCE DAILY 08/08/24   Alvan Dorothyann BIRCH, MD    Family History Family History  Problem Relation Age of Onset   Osteoporosis Mother    Constipation Mother    Healthy Father     Social History Social History   Tobacco Use   Smoking status: Never   Smokeless tobacco: Never  Vaping Use   Vaping status: Never Used  Substance Use Topics   Alcohol use: Not Currently   Drug use: No  Allergies   Patient has no known allergies.   Review of Systems Review of Systems Per HPI  Physical Exam Triage Vital Signs ED Triage Vitals  Encounter Vitals Group     BP 08/11/24 1427 116/70     Girls Systolic BP Percentile --      Girls Diastolic BP Percentile --      Boys Systolic BP Percentile --      Boys Diastolic BP Percentile --      Pulse Rate 08/11/24 1427 99     Resp 08/11/24 1427 16     Temp 08/11/24 1427 98.4 F (36.9 C)     Temp src --      SpO2 08/11/24 1427 98 %     Weight --      Height --      Head Circumference --      Peak Flow --      Pain Score 08/11/24 1430 8     Pain  Loc --      Pain Education --      Exclude from Growth Chart --    No data found.  Updated Vital Signs BP 116/70   Pulse 99   Temp 98.4 F (36.9 C)   Resp 16   LMP 07/24/2024 (Exact Date)   SpO2 98%   Visual Acuity Right Eye Distance:   Left Eye Distance:   Bilateral Distance:    Right Eye Near:   Left Eye Near:    Bilateral Near:     Physical Exam Constitutional:      General: She is not in acute distress.    Appearance: Normal appearance. She is not toxic-appearing or diaphoretic.  HENT:     Head: Normocephalic and atraumatic.     Right Ear: Tympanic membrane and ear canal normal.     Left Ear: Tympanic membrane and ear canal normal.     Nose: Congestion present.     Right Sinus: Maxillary sinus tenderness present. No frontal sinus tenderness.     Left Sinus: Maxillary sinus tenderness present. No frontal sinus tenderness.     Mouth/Throat:     Mouth: Mucous membranes are moist.     Pharynx: No posterior oropharyngeal erythema.  Eyes:     Extraocular Movements: Extraocular movements intact.     Conjunctiva/sclera: Conjunctivae normal.     Pupils: Pupils are equal, round, and reactive to light.  Cardiovascular:     Rate and Rhythm: Normal rate and regular rhythm.     Pulses: Normal pulses.     Heart sounds: Normal heart sounds.  Pulmonary:     Effort: Pulmonary effort is normal. No respiratory distress.     Breath sounds: Normal breath sounds. No stridor. No wheezing, rhonchi or rales.  Musculoskeletal:        General: Normal range of motion.     Cervical back: Normal range of motion.  Skin:    General: Skin is warm and dry.  Neurological:     General: No focal deficit present.     Mental Status: She is alert and oriented to person, place, and time. Mental status is at baseline.  Psychiatric:        Mood and Affect: Mood normal.        Behavior: Behavior normal.      UC Treatments / Results  Labs (all labs ordered are listed, but only abnormal  results are displayed) Labs Reviewed - No data to display  EKG   Radiology No results  found.  Procedures Procedures (including critical care time)  Medications Ordered in UC Medications - No data to display  Initial Impression / Assessment and Plan / UC Course  I have reviewed the triage vital signs and the nursing notes.  Pertinent labs & imaging results that were available during my care of the patient were reviewed by me and considered in my medical decision making (see chart for details).     Patient presenting with persistent maxillary sinus infection.  There are no adventitious lung sounds on exam so do not think that chest imaging is necessary.  Given patient has been on 3 antibiotics, antihistamine, steroid I did discuss patient's clinical management with supervising physician, Dr. Rolinda.  Will treat with Bactrim  for 10 days and refer to ENT.  Ambulatory referral to ENT was placed.  Advised patient that if they do not call her to schedule an appointment, she is to call them herself at provided contact information.  Given patient has been on multiple antibiotics recently, recommended gastrointestinal probiotic which patient reports she is already taking.  Advised strict follow-up precautions.  Patient verbalized understanding and was agreeable with plan. Final Clinical Impressions(s) / UC Diagnoses   Final diagnoses:  Acute recurrent maxillary sinusitis     Discharge Instructions      I have prescribed an antibiotic to treat sinus infection.  Referral to ENT has been placed.  If they do not call you, please call them yourself at provided contact for further evaluation and management.    ED Prescriptions     Medication Sig Dispense Auth. Provider   sulfamethoxazole -trimethoprim  (BACTRIM  DS) 800-160 MG tablet Take 1 tablet by mouth 2 (two) times daily for 10 days. 20 tablet Toxey, Jsoeph Podesta E, [place]      PDMP not reviewed this encounter.   Hazen Darryle BRAVO, [place] 08/11/24  [phone]

## 2024-08-12 ENCOUNTER — Telehealth: Payer: Self-pay

## 2024-08-12 NOTE — Telephone Encounter (Signed)
Called to check on patient. No answer. Voicemail full

## 2024-08-15 DIAGNOSIS — J342 Deviated nasal septum: Secondary | ICD-10-CM | POA: Diagnosis not present

## 2024-08-15 DIAGNOSIS — J310 Chronic rhinitis: Secondary | ICD-10-CM | POA: Diagnosis not present

## 2024-08-15 DIAGNOSIS — J3089 Other allergic rhinitis: Secondary | ICD-10-CM | POA: Diagnosis not present

## 2024-08-15 DIAGNOSIS — J329 Chronic sinusitis, unspecified: Secondary | ICD-10-CM | POA: Diagnosis not present

## 2024-08-20 ENCOUNTER — Ambulatory Visit: Admitting: Family Medicine

## 2024-08-20 ENCOUNTER — Encounter: Payer: Self-pay | Admitting: Family Medicine

## 2024-08-20 VITALS — BP 116/69 | HR 85 | Ht 69.0 in | Wt 212.0 lb

## 2024-08-20 DIAGNOSIS — J0101 Acute recurrent maxillary sinusitis: Secondary | ICD-10-CM | POA: Diagnosis not present

## 2024-08-20 NOTE — Progress Notes (Signed)
 Acute Office Visit  Patient ID: Kristina Harrington, female    DOB: February 23, 1981, 43 y.o.   MRN: [id number]  PCP: Alvan Dorothyann BIRCH, MD  Chief Complaint  Patient presents with   Follow-up         Subjective:     HPI  Discussed the use of AI scribe software for clinical note transcription with the patient, who gave verbal consent to proceed.  History of Present Illness Kristina Harrington is a 43 year old female who presents with persistent sinus congestion and pressure.  Sinus congestion and pressure - Persistent sinus congestion and pressure since September - Initial onset accompanied by sore throat and sinus congestion, prompting urgent care visit on September 26th - Multiple courses of antibiotics: Omnicef  (September), doxycycline  (November), Bactrim  DS (November 29th, ten-day course currently being completed) - Five-day course of prednisone  completed in November - Ongoing symptoms despite treatment, including facial pain and sinus area discomfort - Sinus pressure and congestion remain refractory to antibiotics and steroids  Upper respiratory symptoms - Sore throat present, primarily on the right side - Cough with productive yellow-green sputum for one week in November - No chest symptoms; symptoms localized to head and neck  Otologic symptoms - Ear fullness and popping sensations, predominantly on the left side - Difficulty hearing associated with ear symptoms  Current and adjunctive therapies - Currently completing last doses of Bactrim  DS - Uses neti pot twice daily for nasal irrigation - Compound powder added to nasal irrigation, previously used - Flonase  administered in the morning  Environmental exposures - Works as a runner, broadcasting/film/video; classroom environment with poor air quality and ventilation may contribute to symptoms - 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  HENT:     Head: Normocephalic and atraumatic.     Right Ear: Tympanic membrane, ear canal and external ear normal. There is no impacted cerumen.     Left Ear: Tympanic membrane, ear canal and external ear normal. There is no impacted cerumen.     Nose: Nose normal.     Mouth/Throat:     Pharynx: Oropharynx is clear.  Eyes:     Conjunctiva/sclera: Conjunctivae normal.  Cardiovascular:     Rate and Rhythm: Normal rate and regular rhythm.  Pulmonary:     Effort: Pulmonary effort is normal.     Breath sounds: Normal breath sounds.  Musculoskeletal:     Cervical back: Neck supple. No tenderness.  Lymphadenopathy:     Cervical: No cervical adenopathy.  Skin:    General: Skin is warm and dry.  Neurological:     Mental Status: She is alert and oriented to person, place, and time.  Psychiatric:        Mood and Affect: Mood normal.       No results found for any visits on 08/20/24.     Assessment & Plan:   Problem List Items Addressed This Visit   None Visit Diagnoses       Acute recurrent maxillary sinusitis    -  Primary       Assessment and Plan Assessment & Plan Chronic or recurrent sinusitis with Eustachian tube  dysfunction Persistent nasal congestion, sinus pressure, and Eustachian tube dysfunction. ENT evaluation showed left-sided sinus swelling. Differential includes bacterial sinusitis versus environmental factors. Risks of repeated antibiotic use discussed. - Pause antibiotics and complete current course of Bactrim  DS. - Monitor symptoms and contact provider if symptoms worsen. - Await ENT culture results to guide further antibiotic therapy. - Consider prednisone  if symptoms worsen or pressure increases. - Use Flonase  in the morning. - Use neti pot with compound powder for nasal irrigation. - Consider antihistamine like  Allegra  or Zyrtec for potential allergic component.Monitor for overly drying sxs.   Allergic rhinitis Potential allergic component contributing to sinus symptoms. Discussion of antihistamine use to manage symptoms, with caution regarding drying effects. - Consider starting Allegra  or Zyrtec daily to manage allergic symptoms. - Monitor for drying effects on secretions and adjust as needed. - Ensure adequate hydration while on antihistamines.    No orders of the defined types were placed in this encounter.   No follow-ups on file.  Dorothyann Byars, MD [facility] at [facility]

## 2024-10-12 ENCOUNTER — Encounter: Payer: Self-pay | Admitting: Family Medicine

## 2024-10-12 DIAGNOSIS — F909 Attention-deficit hyperactivity disorder, unspecified type: Secondary | ICD-10-CM

## 2024-10-12 MED ORDER — AMPHETAMINE-DEXTROAMPHET ER 30 MG PO CP24: 30.0000 mg | ORAL_CAPSULE | ORAL | 0 refills | Status: AC

## 2024-12-10 ENCOUNTER — Ambulatory Visit: Admitting: Family Medicine
